# Patient Record
Sex: Male | Born: 1985 | Race: White | Hispanic: No | Marital: Single | Smoking: Current some day smoker
Health system: Southern US, Community
[De-identification: ages and names within clinical notes are randomized; demographics above are authoritative.]

## PROBLEM LIST (undated history)

## (undated) DIAGNOSIS — Z8249 Family history of ischemic heart disease and other diseases of the circulatory system: Secondary | ICD-10-CM

## (undated) DIAGNOSIS — I10 Essential (primary) hypertension: Secondary | ICD-10-CM

## (undated) DIAGNOSIS — R739 Hyperglycemia, unspecified: Secondary | ICD-10-CM

## (undated) DIAGNOSIS — J45909 Unspecified asthma, uncomplicated: Secondary | ICD-10-CM

## (undated) DIAGNOSIS — Z720 Tobacco use: Secondary | ICD-10-CM

## (undated) DIAGNOSIS — I498 Other specified cardiac arrhythmias: Secondary | ICD-10-CM

## (undated) DIAGNOSIS — R9431 Abnormal electrocardiogram [ECG] [EKG]: Secondary | ICD-10-CM

## (undated) DIAGNOSIS — J939 Pneumothorax, unspecified: Secondary | ICD-10-CM

## (undated) DIAGNOSIS — G4733 Obstructive sleep apnea (adult) (pediatric): Secondary | ICD-10-CM

## (undated) DIAGNOSIS — Z9989 Dependence on other enabling machines and devices: Principal | ICD-10-CM

## (undated) DIAGNOSIS — E119 Type 2 diabetes mellitus without complications: Secondary | ICD-10-CM

## (undated) HISTORY — DX: Abnormal electrocardiogram (ECG) (EKG): R94.31

## (undated) HISTORY — DX: Dependence on other enabling machines and devices: Z99.89

## (undated) HISTORY — PX: APPENDECTOMY: SHX54

## (undated) HISTORY — DX: Obstructive sleep apnea (adult) (pediatric): G47.33

---

## 2012-11-15 ENCOUNTER — Emergency Department (HOSPITAL_BASED_OUTPATIENT_CLINIC_OR_DEPARTMENT_OTHER)
Admission: EM | Admit: 2012-11-15 | Discharge: 2012-11-15 | Disposition: A | Discharge status: Home / Self Care | Payer: PRIVATE HEALTH INSURANCE | Attending: Emergency Medicine | Admitting: Emergency Medicine

## 2012-11-15 ENCOUNTER — Encounter (HOSPITAL_BASED_OUTPATIENT_CLINIC_OR_DEPARTMENT_OTHER): Payer: Self-pay | Admitting: *Deleted

## 2012-11-15 DIAGNOSIS — R51 Headache: Secondary | ICD-10-CM | POA: Insufficient documentation

## 2012-11-15 DIAGNOSIS — H9209 Otalgia, unspecified ear: Secondary | ICD-10-CM | POA: Insufficient documentation

## 2012-11-15 DIAGNOSIS — J029 Acute pharyngitis, unspecified: Secondary | ICD-10-CM | POA: Insufficient documentation

## 2012-11-15 DIAGNOSIS — R509 Fever, unspecified: Secondary | ICD-10-CM | POA: Insufficient documentation

## 2012-11-15 DIAGNOSIS — J3489 Other specified disorders of nose and nasal sinuses: Secondary | ICD-10-CM | POA: Insufficient documentation

## 2012-11-15 DIAGNOSIS — J45909 Unspecified asthma, uncomplicated: Secondary | ICD-10-CM | POA: Insufficient documentation

## 2012-11-15 DIAGNOSIS — R42 Dizziness and giddiness: Secondary | ICD-10-CM | POA: Insufficient documentation

## 2012-11-15 HISTORY — DX: Unspecified asthma, uncomplicated: J45.909

## 2012-11-15 LAB — RAPID STREP SCREEN (MED CTR MEBANE ONLY): Streptococcus, Group A Screen (Direct): NEGATIVE

## 2012-11-15 LAB — MONONUCLEOSIS SCREEN: Mono Screen: NEGATIVE

## 2012-11-15 MED ORDER — OXYCODONE-ACETAMINOPHEN 5-325 MG PO TABS
1.0000 | ORAL_TABLET | Freq: Once | ORAL | Status: AC
  Administered 2012-11-15: 1 via ORAL
  Filled 2012-11-15 (×2): qty 1

## 2012-11-15 MED ORDER — PENICILLIN V POTASSIUM 500 MG PO TABS: 500.0000 mg | ORAL_TABLET | Freq: Four times a day (QID) | ORAL | Status: AC

## 2012-11-15 NOTE — ED Notes (Signed)
Pt ambulatory to triage exam room noted a white arm band on ask what it was pt states he was at [facility] and it was taking them too long to see him so he left and came here.

## 2012-11-15 NOTE — ED Notes (Signed)
3 days of cough sore throat congestion and ear ache states sputum is yellow to greenish

## 2012-11-15 NOTE — ED Provider Notes (Signed)
History     CSN: [id number]  Arrival date & time 11/15/12  1403   First MD Initiated Contact with Patient 11/15/12 1436      Chief Complaint  Patient presents with  . Otalgia  . Sore Throat    HPI Emily Forse is a 27 y.o. male who presents to the ED with sore throat. This is a new problem. The sore throat started 4 days ago. Associated symptoms include fever, headache, ear ache and swollen glands. He rates his pain as 10/10. The history was provided by the patient.  Past Medical History  Diagnosis Date  . Asthma     Past Surgical History  Procedure Laterality Date  . Appendectomy      History reviewed. No pertinent family history.  History  Substance Use Topics  . Smoking status: Never Smoker   . Smokeless tobacco: Not on file  . Alcohol Use: No      Review of Systems  Constitutional: Positive for fever and chills. Negative for diaphoresis and fatigue.  HENT: Positive for ear pain, congestion, sore throat and sinus pressure. Negative for facial swelling, neck pain, neck stiffness and dental problem. Trouble swallowing: pain with swallowing.   Eyes: Negative for photophobia, pain, discharge and visual disturbance.  Respiratory: Positive for cough. Negative for chest tightness and wheezing.   Cardiovascular: Negative for chest pain and palpitations.  Gastrointestinal: Negative for nausea, vomiting, abdominal pain, diarrhea, constipation and abdominal distention.  Genitourinary: Negative for dysuria, frequency, flank pain and difficulty urinating.  Musculoskeletal: Negative for myalgias, back pain and gait problem.  Skin: Negative for color change and rash.  Allergic/Immunologic: Negative for food allergies and immunocompromised state.  Neurological: Positive for dizziness, light-headedness and headaches. Negative for speech difficulty, weakness and numbness.  Psychiatric/Behavioral: Negative for confusion and agitation. The patient is not nervous/anxious.      Allergies  Review of patient's allergies indicates no known allergies.  Home Medications  No current outpatient prescriptions on file.  BP 132/99  Pulse 94  Temp(Src) 98.4 F (36.9 C) (Oral)  Ht 5\' 9"  (1.753 m)  Wt 245 lb (111.131 kg)  BMI 36.16 kg/m2  SpO2 97%  Physical Exam  Nursing note and vitals reviewed. Constitutional: He is oriented to person, place, and time. He appears well-developed and well-nourished. No distress.  Elevated BP  HENT:  Head: Normocephalic and atraumatic.  Right Ear: Tympanic membrane normal. No drainage.  Left Ear: Tympanic membrane normal. No drainage.  Nose: Nose normal.  Mouth/Throat: Uvula is midline and mucous membranes are normal. Posterior oropharyngeal erythema present.  Eyes: EOM are normal. Pupils are equal, round, and reactive to light.  Neck: Neck supple.  Cardiovascular: Normal rate and regular rhythm.   Pulmonary/Chest: Effort normal. No respiratory distress. He has no wheezes.  Abdominal: Soft. Bowel sounds are normal. There is no tenderness.  Musculoskeletal: Normal range of motion. He exhibits no edema.  Lymphadenopathy:    He has cervical adenopathy.  Neurological: He is alert and oriented to person, place, and time. No cranial nerve deficit.  Skin: Skin is warm and dry.  Psychiatric: He has a normal mood and affect. His behavior is normal. Judgment and thought content normal.   Procedures  Results for orders placed during the hospital encounter of 11/15/12 (from the past 24 hour(s))  RAPID STREP SCREEN     Status: None   Collection Time    11/15/12  2:47 PM      Result Value Range   Streptococcus, Group A  Screen (Direct) NEGATIVE  NEGATIVE  MONONUCLEOSIS SCREEN     Status: None   Collection Time    11/15/12  3:50 PM      Result Value Range   Mono Screen NEGATIVE  NEGATIVE    Assessment: 27 y.o. male with sore tharoat   Pharyngitis   Strep and mono negative   Strep culture pending  Plan:  Pain management with  tylenol and ibuprofen   Start Pen VK while culture pending. Discussed with the patient and all questioned fully answered. He will return if any problems arise.    Medication List    TAKE these medications       penicillin v potassium 500 MG tablet  Commonly known as:  VEETID  Take 1 tablet (500 mg total) by mouth 4 (four) times daily.          Janne Napoleon, [place] 11/15/12 [phone]

## 2012-11-16 LAB — STREP A DNA PROBE: Group A Strep Probe: NEGATIVE

## 2012-11-16 NOTE — ED Provider Notes (Signed)
Medical screening examination/treatment/procedure(s) were performed by non-physician practitioner and as supervising physician I was immediately available for consultation/collaboration.  Milferd Ansell, MD 11/16/12 0651 

## 2013-05-06 ENCOUNTER — Encounter (HOSPITAL_BASED_OUTPATIENT_CLINIC_OR_DEPARTMENT_OTHER): Payer: Self-pay | Admitting: *Deleted

## 2013-05-06 ENCOUNTER — Emergency Department (HOSPITAL_BASED_OUTPATIENT_CLINIC_OR_DEPARTMENT_OTHER)
Admission: EM | Admit: 2013-05-06 | Discharge: 2013-05-06 | Disposition: A | Discharge status: Home / Self Care | Payer: PRIVATE HEALTH INSURANCE | Attending: Emergency Medicine | Admitting: Emergency Medicine

## 2013-05-06 DIAGNOSIS — T23202A Burn of second degree of left hand, unspecified site, initial encounter: Secondary | ICD-10-CM

## 2013-05-06 DIAGNOSIS — Y9389 Activity, other specified: Secondary | ICD-10-CM | POA: Insufficient documentation

## 2013-05-06 DIAGNOSIS — T23209A Burn of second degree of unspecified hand, unspecified site, initial encounter: Secondary | ICD-10-CM | POA: Insufficient documentation

## 2013-05-06 DIAGNOSIS — J45909 Unspecified asthma, uncomplicated: Secondary | ICD-10-CM | POA: Insufficient documentation

## 2013-05-06 DIAGNOSIS — Y929 Unspecified place or not applicable: Secondary | ICD-10-CM | POA: Insufficient documentation

## 2013-05-06 DIAGNOSIS — X010XXA Exposure to flames in uncontrolled fire, not in building or structure, initial encounter: Secondary | ICD-10-CM | POA: Insufficient documentation

## 2013-05-06 MED ORDER — IBUPROFEN 800 MG PO TABS
800.0000 mg | ORAL_TABLET | Freq: Once | ORAL | Status: AC
  Administered 2013-05-06: 800 mg via ORAL
  Filled 2013-05-06: qty 1

## 2013-05-06 MED ORDER — IBUPROFEN 600 MG PO TABS: 600.0000 mg | ORAL_TABLET | Freq: Three times a day (TID) | ORAL | Status: DC | PRN

## 2013-05-06 NOTE — ED Notes (Signed)
Per verbal order from [place], [place], dressed patients hand with bacitracin and nonstick dressing.Patient educated on care of wound at home
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
## 2015-09-05 ENCOUNTER — Emergency Department (HOSPITAL_BASED_OUTPATIENT_CLINIC_OR_DEPARTMENT_OTHER)
Admission: EM | Admit: 2015-09-05 | Discharge: 2015-09-05 | Disposition: A | Discharge status: Home / Self Care | Payer: PRIVATE HEALTH INSURANCE | Attending: Emergency Medicine | Admitting: Emergency Medicine

## 2015-09-05 ENCOUNTER — Encounter (HOSPITAL_BASED_OUTPATIENT_CLINIC_OR_DEPARTMENT_OTHER): Payer: Self-pay | Admitting: Emergency Medicine

## 2015-09-05 DIAGNOSIS — J45909 Unspecified asthma, uncomplicated: Secondary | ICD-10-CM | POA: Insufficient documentation

## 2015-09-05 DIAGNOSIS — K029 Dental caries, unspecified: Secondary | ICD-10-CM | POA: Insufficient documentation

## 2015-09-05 DIAGNOSIS — K0889 Other specified disorders of teeth and supporting structures: Secondary | ICD-10-CM | POA: Insufficient documentation

## 2015-09-05 DIAGNOSIS — G8918 Other acute postprocedural pain: Secondary | ICD-10-CM | POA: Insufficient documentation

## 2015-09-05 MED ORDER — ONDANSETRON HCL 4 MG PO TABS: 4.0000 mg | ORAL_TABLET | Freq: Four times a day (QID) | ORAL | Status: DC

## 2015-09-05 MED ORDER — OXYCODONE-ACETAMINOPHEN 5-325 MG PO TABS: 1.0000 | ORAL_TABLET | Freq: Four times a day (QID) | ORAL | Status: DC | PRN

## 2015-09-05 MED ORDER — LIDOCAINE HCL (PF) 1 % IJ SOLN
2.0000 mL | Freq: Once | INTRAMUSCULAR | Status: AC
  Administered 2015-09-05: 2 mL
  Filled 2015-09-05: qty 5

## 2015-09-05 NOTE — ED Provider Notes (Signed)
CSN: [id number]     Arrival date & time 09/05/15  1307 History   First MD Initiated Contact with Patient 09/05/15 1358     Chief Complaint  Patient presents with  . Dental Pain     (Consider location/radiation/quality/duration/timing/severity/associated sxs/prior Treatment) HPI   Patient presents to the emergency department with complaints of dental pain. He had a dental extraction done on Thursday, is feeling well up until this morning where he started having severe pain to the area. She has been taking amoxicillin and Norco as needed for his pain. He denies having fevers, nausea, vomiting, diarrhea. He denies bleeding site or discharge. He did not try calling the dentist that pulled tooth.  Past Medical History  Diagnosis Date  . Asthma    Past Surgical History  Procedure Laterality Date  . Appendectomy     History reviewed. No pertinent family history. Social History  Substance Use Topics  . Smoking status: Never Smoker   . Smokeless tobacco: None  . Alcohol Use: No    Review of Systems  All other systems reviewed and are negative.   Allergies  Review of patient's allergies indicates no known allergies.  Home Medications   Prior to Admission medications   Medication Sig Start Date End Date Taking? Authorizing Provider  ibuprofen (ADVIL,MOTRIN) 600 MG tablet Take 1 tablet (600 mg total) by mouth every 8 (eight) hours as needed for pain. 05/06/13   Azalia Bilis, MD  ondansetron (ZOFRAN) 4 MG tablet Take 1 tablet (4 mg total) by mouth every 6 (six) hours. 09/05/15   Marlon Pel, PA-C  oxyCODONE-acetaminophen (PERCOCET/ROXICET) 5-325 MG tablet Take 1 tablet by mouth every 6 (six) hours as needed for severe pain. 09/05/15   Antwonette Feliz Neva Seat, PA-C   BP 124/82 mmHg  Pulse 77  Temp(Src) 98.5 F (36.9 C) (Oral)  Resp 18  Ht  (1.753 m)  Wt 117.935 kg  BMI 38.38 kg/m2  SpO2 100% Physical Exam  Constitutional: He appears well-developed and well-nourished. No distress.    HENT:  Head: Normocephalic and atraumatic.  Mouth/Throat: Oropharynx is clear and moist. No trismus in the jaw. Normal dentition. Dental caries present. No dental abscesses, uvula swelling or lacerations.    Dry socket  Eyes: Conjunctivae and EOM are normal. Pupils are equal, round, and reactive to light.  Neck: Normal range of motion. Neck supple.  Cardiovascular: Normal rate and regular rhythm.   Pulmonary/Chest: Effort normal and breath sounds normal.  Abdominal: Soft.  Neurological: He is alert.  Skin: Skin is warm and dry.  Nursing note and vitals reviewed.   ED Course  Procedures (including critical care time) Labs Review Labs Reviewed - No data to display  Imaging Review No results found. I have personally reviewed and evaluated these images and lab results as part of my medical decision-making.   EKG Interpretation None      MDM   Final diagnoses:  Postoperative pain  Toothache   Discussed treatment with doctor Pfeiffer, Dental block performed, thoroughly irrigated socket. To call dentist on Monday for same day follow-up appt.  DENTAL NERVE BLOCK Date/Time: 09/05/2015 Performed by: Dorthula Matas Authorized by: Dorthula Matas Consent: Verbal consent obtained. Risks and benefits: risks, benefits and alternatives were discussed Consent given by: patient Indications: pain relief Body area: face/mouth Laterality: right upper Needle gauge: 25 G Local anesthetic: lidocaine 2% without epinephrine Anesthetic total: 2 ml Outcome: pain improved Patient tolerance: Patient tolerated the procedure well with no immediate complications. Comments: Patient  had complete relief of pain.  Medications  lidocaine (PF) (XYLOCAINE) 1 % injection 2 mL (2 mLs Other Given by Other 09/05/15 1427)    29 y.o.Joseph Krause's evaluation in the Emergency Department is complete.  We have discussed signs and symptoms that warrant return to the ED, such as changes or worsening in  symptoms. No emergent s/sx's present. Patent airway. No trismus.  No neck tenderness or protrusion of tongue or floor of mouth. Patient will be given an rx for Percocet 6 tabs and Zofran. He will be referred to a dentist with instructions for follow-up.  Vital signs are stable at discharge. Filed Vitals:   09/05/15 1312  BP: 124/82  Pulse: 77  Temp: 98.5 F (36.9 C)  Resp: 18    Patient/guardian has voiced understanding and agreed to follow-up with the PCP or specialist.     Marlon Peliffany Lauriann Milillo, PA-C 09/05/15 1457  Arby BarretteMarcy Pfeiffer, MD 09/05/15 1530

## 2015-09-05 NOTE — ED Notes (Signed)
Patient states that he had a root canal done on Thursday. Since he feels like the stiches have come out or apart,. He also feels like he may have dry scocket

## 2015-09-05 NOTE — Discharge Instructions (Signed)
Dental Extraction, Care After  Refer to this sheet in the next few weeks. These instructions provide you with information about caring for yourself after your procedure. Your health care provider may also give you more specific instructions. Your treatment has been planned according to current medical practices, but problems sometimes occur. Call your health care provider if you have any problems or questions after your procedure.  WHAT TO EXPECT AFTER THE PROCEDURE  After your procedure, it is common to have:   · Pain and swelling for a few days.  · Numbness for a few hours after the procedure.  HOME CARE INSTRUCTIONS  Lifestyle  · Protect the area where your tooth was extracted, even if there is no pain.  · Do not smoke, do not spit, and do not drink through a straw until your health care provider says it is okay.  · Eat soft-textured foods as directed by your health care provider. Avoid hot drinks and spicy foods until your gum has healed.  Incision Care  · Follow instructions from your health care provider about:    Incision care.    Gauze changes and removal.    Incision closure removal.  · Do not chew on the gauze.  · If you have heavy bleeding from your gum, fold a clean piece of gauze, place it on the bleeding gum, and bite on it as directed by your health care provider.  General Instructions  · Take medicines only as directed by your health care provider.  · Do not rinse your mouth until your health care provider says it is okay. Once you are told that you can rinse your mouth, do not rinse with a lot of force (vigorously). Doing that can break up the needed clot that forms where your tooth was extracted.    You may rinse your mouth with warm salt water after your health care provider says it is okay. You can make a salt rinse by mixing one teaspoon of salt in two cups of warm water. Do this as directed by your health care provider.  · Do not brush or floss near the tooth socket where your tooth was  extracted until your health care provider says it is okay. You may brush your other teeth.  · If directed, apply ice to your cheek on the affected side of your mouth:    Put ice in a plastic bag.    Place a towel between your skin and the bag.    Leave the ice on for 20 minutes, 2-3 times a day.  · Keep all follow-up visits as directed by your health care provider. This is important.  SEEK MEDICAL CARE IF:  · Your pain is not controlled with medicines.  · You have a fever with nausea, vomiting, or chills.  · You have a severe cough or shortness of breath.  SEEK IMMEDIATE MEDICAL CARE IF:  · You have uncontrolled bleeding, increased swelling, or severe pain.  · You have fluid, blood, or pus coming from the gum where your tooth was extracted.  · You have difficulty swallowing.  · You cannot open your mouth.     This information is not intended to replace advice given to you by your health care provider. Make sure you discuss any questions you have with your health care provider.     Document Released: 01/03/2011 Document Revised: 02/02/2015 Document Reviewed: 09/14/2014  Elsevier Interactive Patient Education ©2016 Elsevier Inc.

## 2015-09-09 ENCOUNTER — Emergency Department (HOSPITAL_BASED_OUTPATIENT_CLINIC_OR_DEPARTMENT_OTHER)
Admission: EM | Admit: 2015-09-09 | Discharge: 2015-09-09 | Disposition: A | Discharge status: Home / Self Care | Payer: PRIVATE HEALTH INSURANCE | Attending: Emergency Medicine | Admitting: Emergency Medicine

## 2015-09-09 ENCOUNTER — Encounter (HOSPITAL_BASED_OUTPATIENT_CLINIC_OR_DEPARTMENT_OTHER): Payer: Self-pay | Admitting: Emergency Medicine

## 2015-09-09 DIAGNOSIS — J45909 Unspecified asthma, uncomplicated: Secondary | ICD-10-CM | POA: Insufficient documentation

## 2015-09-09 DIAGNOSIS — M273 Alveolitis of jaws: Secondary | ICD-10-CM

## 2015-09-09 DIAGNOSIS — F17200 Nicotine dependence, unspecified, uncomplicated: Secondary | ICD-10-CM | POA: Insufficient documentation

## 2015-09-09 MED ORDER — BUPIVACAINE-EPINEPHRINE (PF) 0.5% -1:200000 IJ SOLN
1.8000 mL | Freq: Once | INTRAMUSCULAR | Status: AC
  Administered 2015-09-09: 1.8 mL
  Filled 2015-09-09: qty 1.8

## 2015-09-09 MED ORDER — LIDOCAINE HCL 4 % EX SOLN
Freq: Once | CUTANEOUS | Status: AC
  Administered 2015-09-09: 1 mL via TOPICAL
  Filled 2015-09-09: qty 50

## 2015-09-09 MED ORDER — ONDANSETRON 8 MG PO TBDP: 8.0000 mg | ORAL_TABLET | Freq: Three times a day (TID) | ORAL | Status: DC | PRN

## 2015-09-09 MED ORDER — HYDROMORPHONE HCL 4 MG PO TABS: 4.0000 mg | ORAL_TABLET | ORAL | Status: DC | PRN

## 2015-09-09 NOTE — ED Provider Notes (Signed)
CSN: [id number]     Arrival date & time 09/09/15  0115 History   First MD Initiated Contact with Patient 09/09/15 0217     Chief Complaint  Patient presents with  . Dental Pain     (Consider location/radiation/quality/duration/timing/severity/associated sxs/prior Treatment) HPI  This is a 29 year old male who recently had his right upper first molar extracted. Heat subsequently developed a dry socket after the clot fell out and he was seen here on December 4. A dental block with lidocaine was performed and he followed up with his dentist. Yesterday afternoon about 2 PM the clot fell out again and he is gradually developed pain that is now severe. He has tried over-the-counter medications without relief.  Past Medical History  Diagnosis Date  . Asthma    Past Surgical History  Procedure Laterality Date  . Appendectomy     History reviewed. No pertinent family history. Social History  Substance Use Topics  . Smoking status: Current Some Day Smoker  . Smokeless tobacco: None  . Alcohol Use: Yes    Review of Systems  All other systems reviewed and are negative.   Allergies  Review of patient's allergies indicates no known allergies.  Home Medications   Prior to Admission medications   Medication Sig Start Date End Date Taking? Authorizing Provider  ibuprofen (ADVIL,MOTRIN) 600 MG tablet Take 1 tablet (600 mg total) by mouth every 8 (eight) hours as needed for pain. 05/06/13   Azalia BilisKevin Campos, MD  ondansetron (ZOFRAN) 4 MG tablet Take 1 tablet (4 mg total) by mouth every 6 (six) hours. 09/05/15   Marlon Peliffany Greene, PA-C  oxyCODONE-acetaminophen (PERCOCET/ROXICET) 5-325 MG tablet Take 1 tablet by mouth every 6 (six) hours as needed for severe pain. 09/05/15   Tiffany Neva SeatGreene, PA-C   BP 125/98 mmHg  Pulse 99  Temp(Src) 98.5 F (36.9 C) (Oral)  Resp 20  Ht 5\' 9"  (1.753 m)  Wt 260 lb (117.935 kg)  BMI 38.38 kg/m2  SpO2 100%   Physical Exam  General: Well-developed, well-nourished  male in apparent discomfort; appearance consistent with age of record HENT: normocephalic; atraumatic; right upper first molar extraction socket with tenderness to percussion Eyes: pupils equal, round and reactive to light; extraocular muscles intact Neck: supple; no lymphadenopathy Heart: regular rate and rhythm Lungs: clear to auscultation bilaterally Abdomen: soft; nondistended Extremities: No deformity; full range of motion Neurologic: Awake, alert and oriented; motor function intact in all extremities and symmetric; no facial droop Skin: Warm and dry Psychiatric: Tearful    ED Course  Procedures (including critical care time)  DENTAL BLOCK 1.8 milliliters of 0.5% bupivacaine with epinephrine were injected into the buccal fold adjacent to the right upper first molar extraction socket. The patient tolerated this well and there were no immediate complications. The patient unfortunately did not experience relief of his pain despite stating the soft tissue around his tooth was "all numb". The injection was repeated a second time again without success. The extraction socket was then packed with a commercial dry socket paste containing eugenol. Again the patient did not get relief.   We will provide a prescription for Dilaudid tablets and have him follow-up with his dentist later today. MDM      Paula LibraJohn Rilee Wendling, MD 09/09/15 [phone]

## 2015-09-09 NOTE — Discharge Instructions (Signed)
Dental Dry Socket After a tooth is pulled (extracted), blood fills up the hole (socket) where the tooth once was. This blood hardens (clots) and protects the bone and nerves underneath. Normally, gums completely grow over the top of the bones and nerves and close an open socket in about a week. After several months, the clot is replaced by bone that grows into the socket. However, when blood does not fill up the extraction socket, or the blood clot is lost for some reason, a dry socket may form. This condition leaves the bone and nerves exposed to air, food, liquid, or anything else that enters the mouth. The gums cannot grow over the extraction socket because there is nothing to grow over, and the dry socket remains open.  CAUSES   Blood not filling up the extraction socket properly.  Anything that can dislodge a forming blood clot. Forceful spitting or sucking through a straw can pull a blood clot completely out of the socket and cause a dry socket.  Having a difficult extraction. The forceful pushing against the wall of the socket when the tooth is extracted causes the walls of the tooth socket to become crushed. This prevents bleeding into the socket because the blood vessels have been crushed closed.  Alcoholic drinks may dry out the blood clot and cause a dry socket.  Smoking can disturb blood clot formation and cause a dry socket. Factors that put you at an increased risk for a dry socket include:   Having lower teeth extracted.  Being male.  Poor oral hygiene.  Taking birth control pills.  Having your wisdom teeth extracted. SYMPTOMS  Severe, constant, dull throbbing pain. The pain generally begins 2 to 3 days after the tooth extraction. The pain may last about a week after it begins.  Bad smelling breath and bad taste in your mouth.  Ear pain. HOME CARE INSTRUCTIONS  Follow your dentist's instructions.  Only take over-the-counter or prescription medicines for pain,  discomfort, or fever as directed by your caregiver. If pain medication does not relieve the pain, you may need to see your dentistwho can clean the socket and place a medicated dressing on the extraction to promote healing.  Take your antibiotics as told, if prescribed. Finish them even if you start to feel better.  Wait at least a day before rinsing with warm salt water to avoid possibly dissolving the new blood clot. When salt water rinsing, spit gently to avoid pressure on the clot.  Avoid carbonated beverages.  Avoid alcohol.  Avoid smoking for a few days after surgery. Patients who have recently had oral surgery should avoid anything that may irritate the extraction socket or anything that may cause the blood clot inside the extraction socket from being dislodged. Carefully follow your instructions for after surgery care.  SEEK IMMEDIATE DENTAL CARE IF:  Your medicine does not relieve pain.  You have uncontrolled bleeding or marked swelling.  You develop a fever above 102 F (38.9 C)  You have difficulty swallowing or cannot open your mouth.  You have other severe symptoms.   This information is not intended to replace advice given to you by your health care provider. Make sure you discuss any questions you have with your health care provider.   Document Released: 03/25/2003 Document Revised: 12/11/2011 Document Reviewed: 05/03/2015 Elsevier Interactive Patient Education [employer]2016 Elsevier Inc.

## 2015-09-09 NOTE — ED Notes (Signed)
C/o pain to rt upper jaw where tooth was removed 1 week ago   States coughed today and area started bleeding  Then started having pain   States same thing happened on Sunday and was seen here

## 2016-05-19 ENCOUNTER — Encounter (HOSPITAL_BASED_OUTPATIENT_CLINIC_OR_DEPARTMENT_OTHER): Payer: Self-pay | Admitting: Emergency Medicine

## 2016-05-19 ENCOUNTER — Observation Stay (HOSPITAL_BASED_OUTPATIENT_CLINIC_OR_DEPARTMENT_OTHER)
Admission: EM | Admit: 2016-05-19 | Discharge: 2016-05-21 | Disposition: A | Discharge status: Home / Self Care | Payer: PRIVATE HEALTH INSURANCE | Attending: Cardiovascular Disease | Admitting: Cardiovascular Disease

## 2016-05-19 ENCOUNTER — Emergency Department (HOSPITAL_BASED_OUTPATIENT_CLINIC_OR_DEPARTMENT_OTHER): Payer: Self-pay

## 2016-05-19 DIAGNOSIS — R9431 Abnormal electrocardiogram [ECG] [EKG]: Secondary | ICD-10-CM | POA: Insufficient documentation

## 2016-05-19 DIAGNOSIS — Z720 Tobacco use: Secondary | ICD-10-CM | POA: Diagnosis present

## 2016-05-19 DIAGNOSIS — R079 Chest pain, unspecified: Secondary | ICD-10-CM

## 2016-05-19 DIAGNOSIS — Z8249 Family history of ischemic heart disease and other diseases of the circulatory system: Secondary | ICD-10-CM | POA: Insufficient documentation

## 2016-05-19 DIAGNOSIS — F17200 Nicotine dependence, unspecified, uncomplicated: Secondary | ICD-10-CM | POA: Insufficient documentation

## 2016-05-19 DIAGNOSIS — R400 Somnolence: Secondary | ICD-10-CM | POA: Insufficient documentation

## 2016-05-19 DIAGNOSIS — Z6841 Body Mass Index (BMI) 40.0 and over, adult: Secondary | ICD-10-CM | POA: Insufficient documentation

## 2016-05-19 DIAGNOSIS — R072 Precordial pain: Secondary | ICD-10-CM | POA: Diagnosis not present

## 2016-05-19 DIAGNOSIS — R0789 Other chest pain: Principal | ICD-10-CM | POA: Insufficient documentation

## 2016-05-19 DIAGNOSIS — R739 Hyperglycemia, unspecified: Secondary | ICD-10-CM | POA: Diagnosis not present

## 2016-05-19 DIAGNOSIS — I517 Cardiomegaly: Secondary | ICD-10-CM | POA: Insufficient documentation

## 2016-05-19 HISTORY — DX: Family history of ischemic heart disease and other diseases of the circulatory system: Z82.49

## 2016-05-19 HISTORY — DX: Hyperglycemia, unspecified: R73.9

## 2016-05-19 HISTORY — DX: Pneumothorax, unspecified: J93.9

## 2016-05-19 HISTORY — DX: Morbid (severe) obesity due to excess calories: E66.01

## 2016-05-19 HISTORY — DX: Tobacco use: Z72.0

## 2016-05-19 LAB — CBC
HCT: 41.7 % (ref 39.0–52.0)
HCT: 43 % (ref 39.0–52.0)
HEMOGLOBIN: 14.1 g/dL (ref 13.0–17.0)
Hemoglobin: 14.5 g/dL (ref 13.0–17.0)
MCH: 30.8 pg (ref 26.0–34.0)
MCH: 29.8 pg (ref 26.0–34.0)
MCHC: 34.8 g/dL (ref 30.0–36.0)
MCHC: 32.8 g/dL (ref 30.0–36.0)
MCV: 88.5 fL (ref 78.0–100.0)
MCV: 90.9 fL (ref 78.0–100.0)
PLATELETS: 273 10*3/uL (ref 150–400)
Platelets: 289 10*3/uL (ref 150–400)
RBC: 4.71 MIL/uL (ref 4.22–5.81)
RBC: 4.73 MIL/uL (ref 4.22–5.81)
RDW: 13.4 % (ref 11.5–15.5)
RDW: 13.3 % (ref 11.5–15.5)
WBC: 7.1 10*3/uL (ref 4.0–10.5)
WBC: 8.4 10*3/uL (ref 4.0–10.5)

## 2016-05-19 LAB — BRAIN NATRIURETIC PEPTIDE: B Natriuretic Peptide: 10.1 pg/mL (ref 0.0–100.0)

## 2016-05-19 LAB — PROTIME-INR
INR: 0.98
PROTHROMBIN TIME: 13 s (ref 11.4–15.2)

## 2016-05-19 LAB — LIPID PANEL
Cholesterol: 189 mg/dL (ref 0–200)
HDL: 27 mg/dL — ABNORMAL LOW
LDL Cholesterol: 97 mg/dL (ref 0–99)
Total CHOL/HDL Ratio: 7 ratio
Triglycerides: 327 mg/dL — ABNORMAL HIGH
VLDL: 65 mg/dL — ABNORMAL HIGH (ref 0–40)

## 2016-05-19 LAB — BASIC METABOLIC PANEL
ANION GAP: 8 (ref 5–15)
BUN: 11 mg/dL (ref 6–20)
CALCIUM: 8.8 mg/dL — AB (ref 8.9–10.3)
CO2: 25 mmol/L (ref 22–32)
Chloride: 103 mmol/L (ref 101–111)
Creatinine, Ser: 0.71 mg/dL (ref 0.61–1.24)
GFR calc Af Amer: >60 mL/min (ref >60)
Glucose, Bld: 217 mg/dL — ABNORMAL HIGH (ref 65–99)
POTASSIUM: 4.2 mmol/L (ref 3.5–5.1)
SODIUM: 136 mmol/L (ref 135–145)

## 2016-05-19 LAB — RAPID URINE DRUG SCREEN, HOSP PERFORMED
Amphetamines: NOT DETECTED
Barbiturates: NOT DETECTED
Benzodiazepines: NOT DETECTED
Cocaine: NOT DETECTED
Opiates: NOT DETECTED
Tetrahydrocannabinol: NOT DETECTED

## 2016-05-19 LAB — CREATININE, SERUM
CREATININE: 0.79 mg/dL (ref 0.61–1.24)
GFR calc Af Amer: >60 mL/min (ref >60)

## 2016-05-19 LAB — TROPONIN I

## 2016-05-19 LAB — TSH: TSH: 2.568 u[IU]/mL (ref 0.350–4.500)

## 2016-05-19 MED ORDER — SODIUM CHLORIDE 0.9% FLUSH
3.0000 mL | Freq: Two times a day (BID) | INTRAVENOUS | Status: DC
Start: 2016-05-19 — End: 2016-05-21
  Administered 2016-05-19 – 2016-05-20 (×4): 3 mL via INTRAVENOUS

## 2016-05-19 MED ORDER — NITROGLYCERIN 0.4 MG SL SUBL: 0.4000 mg | SUBLINGUAL_TABLET | SUBLINGUAL | Status: DC | PRN

## 2016-05-19 MED ORDER — ACETAMINOPHEN 325 MG PO TABS: 650.0000 mg | ORAL_TABLET | ORAL | Status: DC | PRN

## 2016-05-19 MED ORDER — ONDANSETRON HCL 4 MG/2ML IJ SOLN
4.0000 mg | Freq: Four times a day (QID) | INTRAMUSCULAR | Status: DC | PRN
Start: 2016-05-19 — End: 2016-05-21

## 2016-05-19 MED ORDER — DIPHENHYDRAMINE HCL 25 MG PO CAPS
25.0000 mg | ORAL_CAPSULE | Freq: Once | ORAL | Status: DC
  Filled 2016-05-19: qty 1

## 2016-05-19 MED ORDER — SODIUM CHLORIDE 0.9 % IV SOLN: 250.0000 mL | INTRAVENOUS | Status: DC | PRN

## 2016-05-19 MED ORDER — ASPIRIN 81 MG PO CHEW
324.0000 mg | CHEWABLE_TABLET | Freq: Once | ORAL | Status: DC
  Filled 2016-05-19 (×2): qty 4

## 2016-05-19 MED ORDER — SODIUM CHLORIDE 0.9% FLUSH: 3.0000 mL | INTRAVENOUS | Status: DC | PRN

## 2016-05-19 MED ORDER — NICOTINE 14 MG/24HR TD PT24
14.0000 mg | MEDICATED_PATCH | Freq: Every day | TRANSDERMAL | Status: DC
  Administered 2016-05-19 – 2016-05-21 (×3): 14 mg via TRANSDERMAL
  Filled 2016-05-19 (×3): qty 1

## 2016-05-19 MED ORDER — ENOXAPARIN SODIUM 40 MG/0.4ML ~~LOC~~ SOLN
40.0000 mg | SUBCUTANEOUS | Status: DC
  Administered 2016-05-19 – 2016-05-21 (×3): 40 mg via SUBCUTANEOUS
  Filled 2016-05-19 (×3): qty 0.4

## 2016-05-19 NOTE — ED Triage Notes (Signed)
Left sided chest pain since Wednesday.  Pt states he was at work outside when occurred.  Burning sensation in chest with lightheadedness, and dizziness.  Pt states co-workers stated he seemed "out of it".  Pain is still present but is improved.  Pt went to work yesterday.

## 2016-05-19 NOTE — ED Provider Notes (Signed)
[facility] Provider Note   CSN: [id number] Arrival date & time: 05/19/16  [id number]     History   Chief Complaint Chief Complaint  Patient presents with  . Chest Pain    HPI Joseph Krause is a 30 y.o. male.  30 year old male who presents with chest pain. 2 days ago, the patient was at work doing some digging and began having left-sided chest pain. He has continued to have intermittent chest pain over the past 2 days. The pain is worse with exertion at work. No change with positioning or certain movements. The pain is currently 1/10 in intensity. He endorses associated shortness of breath but no nausea, vomiting, or diaphoresis. No abdominal pain, fevers, or recent illness. No lower extremity edema or orthopnea. Patient notes a significant family history of heart disease on his father's side, with father having MI at age 28, paternal uncle with MI, paternal grandfather with multiple MIs. No hx of drug use.   The history is provided by the patient.  Chest Pain      Past Medical History:  Diagnosis Date  . Asthma   . Pneumothorax     Patient Active Problem List   Diagnosis Date Noted  . Chest pain 05/19/2016    Past Surgical History:  Procedure Laterality Date  . APPENDECTOMY         Home Medications    Prior to Admission medications   Medication Sig Start Date End Date Taking? Authorizing Provider  HYDROmorphone (DILAUDID) 4 MG tablet Take 1 tablet (4 mg total) by mouth every 4 (four) hours as needed for severe pain. 09/09/15   John Molpus, MD  ibuprofen (ADVIL,MOTRIN) 600 MG tablet Take 1 tablet (600 mg total) by mouth every 8 (eight) hours as needed for pain. 05/06/13   Azalia BilisKevin Campos, MD  ondansetron (ZOFRAN ODT) 8 MG disintegrating tablet Take 1 tablet (8 mg total) by mouth every 8 (eight) hours as needed for nausea or vomiting. 09/09/15   John Molpus, MD  ondansetron (ZOFRAN) 4 MG tablet Take 1 tablet (4 mg total) by mouth every 6 (six) hours. 09/05/15   Marlon Peliffany  Greene, PA-C  oxyCODONE-acetaminophen (PERCOCET/ROXICET) 5-325 MG tablet Take 1 tablet by mouth every 6 (six) hours as needed for severe pain. 09/05/15   Marlon Peliffany Greene, PA-C    Family History No family history on file.  Social History Social History  Substance Use Topics  . Smoking status: Current Some Day Smoker  . Smokeless tobacco: Never Used  . Alcohol use Yes     Allergies   Review of patient's allergies indicates no known allergies.   Review of Systems Review of Systems  Cardiovascular: Positive for chest pain.   10 Systems reviewed and are negative for acute change except as noted in the HPI.   Physical Exam Updated Vital Signs BP 134/91 (BP Location: Right Arm)   Pulse 78   Temp 98.4 F (36.9 C) (Oral)   Resp 23   Ht 5\' 10"  (1.778 m)   Wt 260 lb (117.9 kg)   SpO2 94%   BMI 37.31 kg/m   Physical Exam  Constitutional: He is oriented to person, place, and time. He appears well-developed and well-nourished. No distress.  HENT:  Head: Normocephalic and atraumatic.  Moist mucous membranes  Eyes: Conjunctivae are normal. Pupils are equal, round, and reactive to light.  Neck: Neck supple.  Cardiovascular: Normal rate, regular rhythm and normal heart sounds.   No murmur heard. Pulmonary/Chest: Effort normal and breath sounds  normal.  Abdominal: Soft. Bowel sounds are normal. He exhibits no distension. There is no tenderness.  Musculoskeletal: He exhibits no edema.  Neurological: He is alert and oriented to person, place, and time.  Fluent speech  Skin: Skin is warm and dry.  Psychiatric: He has a normal mood and affect. Judgment normal.  Nursing note and vitals reviewed.    ED Treatments / Results  Labs (all labs ordered are listed, but only abnormal results are displayed) Labs Reviewed  BASIC METABOLIC PANEL - Abnormal; Notable for the following:       Result Value   Glucose, Bld 217 (*)    Calcium 8.8 (*)    All other components within normal  limits  CBC  TROPONIN I  URINE RAPID DRUG SCREEN, HOSP PERFORMED  BRAIN NATRIURETIC PEPTIDE    EKG  EKG Interpretation  Date/Time:  Friday May 19 2016 09:00:05 EDT Ventricular Rate:  74 PR Interval:    QRS Duration: 129 QT Interval:  390 QTC Calculation: 433 R Axis:   -53 Text Interpretation:  Sinus rhythm Prolonged PR interval Nonspecific IVCD with LAD ST elev, probable normal early repol pattern persistent abnormal T wave/ST elevation in V2 no significant change since last tracing today Confirmed by Romy Mcgue MD, Raissa Dam [phone]) on 05/19/2016 9:11:42 AM       Radiology Dg Chest 2 View  Result Date: 05/19/2016 CLINICAL DATA:  Chest pain. EXAM: CHEST  2 VIEW COMPARISON:  No recent prior. FINDINGS: Cardiomegaly with mild pulmonary vascular prominence. Mild interstitial edema cannot be excluded . Low lung volumes. No pleural effusion or pneumothorax. IMPRESSION: Cardiomegaly with mild pulmonary vascular prominence. Mild interstitial edema cannot be excluded. Low lung volumes. Electronically Signed   By: Maisie Fushomas  Register   On: 05/19/2016 09:01    Procedures Procedures (including critical care time)  Medications Ordered in ED Medications  aspirin chewable tablet 324 mg (not administered)     Initial Impression / Assessment and Plan / ED Course  I have reviewed the triage vital signs and the nursing notes.  Pertinent labs & imaging results that were available during my care of the patient were reviewed by me and considered in my medical decision making (see chart for details).  Clinical Course   Patient presents with 2 days of intermittent chest pain that began while exerting himself at work, associated with shortness of breath. He was well-appearing on exam, vital signs notable for mild hypertension. EKG was abnormal with abnormal T wave morphology in V2, T-wave inversion in 3, borderline ST elevation in aVL. Gave the patient aspirin and obtained above labs.  Chest x-ray shows  cardiomegaly with mild pulmonary vascular congestion. Initial troponin negative. Labs notable only for hyperglycemia at 212. I informed patient of possible diabetes and need for future workup. Because of his concerning EKG findings without previous EKG available, consulted cardiology and discussed with Dr. Mayford Knifeurner. She agreed with no activation of STEMI alert but transfer for admission to workup his chest pain. [facility].  Final Clinical Impressions(s) / ED Diagnoses   Final diagnoses:  Chest pain, unspecified chest pain type  Cardiomegaly  Hyperglycemia  Abnormal finding on EKG    New Prescriptions New Prescriptions   No medications on file     Laurence Spatesachel Morgan Cheyrl Buley, MD 05/19/16 0945

## 2016-05-19 NOTE — ED Notes (Signed)
EKG performed. Pt placed on cardiac monitoring and automatic VS with continuous pulse ox

## 2016-05-19 NOTE — ED Notes (Signed)
Pt given urinal to void when able. Unable at this time.

## 2016-05-19 NOTE — H&P (Signed)
CARDIOLOGY CONSULT NOTE   Patient ID: Joseph Krause MRN: [id number] DOB/AGE: [birth year] 28 y.o.  Admit date: 05/19/2016   Primary Physician:   No PCP Per Patient Primary Cardiologist: New Reason for Admission:  Chest pain and abnormal ECG with possible Brugada syndrome  HPI: Joseph HammingMark Dimiceli is a 30 y.o. male with a history of morbid obesity, tobacco abuse, asthma and family hx of early CAD who presented [facility] today with chest pain. His ECG was noted to be abnormal and [facility] and [facility].   His past medical history is remarkable for a appendectomy over 10 years ago complicated by pneumothorax.  He has otherwise been in okay health but does not ever see a primary care provider. He doesn't remember the last time he saw Dr. He has always been overweight but feels like recently he is having a lot of trouble losing any weight no matter how little he eats.  He was in his usual state of health until last Wednesday when he was at work as a Financial traderdirectional driller. Apparently while working, his coworkers witnessed him have a episode where he "went unresponsive". Patient is unclear if he passed out or not, but he does not remember any of this time.Marland Kitchen. Per his wife telling the nurse, he was out for 2 hours?Marland Kitchen. EMS was called to the scene and apparently his blood pressure was elevated with SBP~ 220 and a heart rate of 160. His heart rate then dropped to 110 when he stood up. EMS told him he was dehydrated and requested he come to the hospital, but the patient refused and continued on with work.  Since that time he's had ongoing chest pain. The chest pain is described as a pressure that comes and goes, but is worse with exertion. It was associated with SOB, but no diaphoresis or nausea. No radiation. Last night it occurred all night as 5/10 constant chest pain. This morning, his wife finally talked him into being seen. He took an aspirin at home and by the time  [facility] got [facility],  he had very dull chest pain. Currently he is chest pain-free.  In [facility] ER, Chest x-ray showed cardiomegaly with mild pulmonary vascular congestion. Troponin and BNP normal. Labs notable only for hyperglycemia at 212.  Family history of early CAD but no sudden cardiac death.  Past Medical History:  Diagnosis Date  . Asthma   . Family history of early CAD   . Hyperglycemia   . Morbid obesity (HCC)   . Pneumothorax   . Tobacco abuse      Past Surgical History:  Procedure Laterality Date  . APPENDECTOMY      No Known Allergies  I have reviewed the patient's current medications . aspirin  324 mg Oral Once       Prior to Admission medications   Medication Sig Start Date End Date Taking? Authorizing Provider  HYDROmorphone (DILAUDID) 4 MG tablet Take 1 tablet (4 mg total) by mouth every 4 (four) hours as needed for severe pain. 09/09/15   John Molpus, MD  ibuprofen (ADVIL,MOTRIN) 600 MG tablet Take 1 tablet (600 mg total) by mouth every 8 (eight) hours as needed for pain. 05/06/13   Azalia BilisKevin Campos, MD  ondansetron (ZOFRAN ODT) 8 MG disintegrating tablet Take 1 tablet (8 mg total) by mouth every 8 (eight) hours as needed for nausea or vomiting. 09/09/15   Jonny RuizJohn  Molpus, MD  ondansetron (ZOFRAN) 4 MG tablet Take 1 tablet (4 mg total) by mouth every 6 (six) hours. 09/05/15   Marlon Peliffany Greene, PA-C  oxyCODONE-acetaminophen (PERCOCET/ROXICET) 5-325 MG tablet Take 1 tablet by mouth every 6 (six) hours as needed for severe pain. 09/05/15   Marlon Peliffany Greene, PA-C     Social History   Social History  . Marital status: Married    Spouse name: N/A  . Number of children: N/A  . Years of education: N/A   Occupational History  . Not on file.   Social History Main Topics  . Smoking status: Current Some Day Smoker  . Smokeless tobacco: Never Used  . Alcohol use Yes  . Drug use: No  . Sexual activity: Yes    Birth control/ protection: Other-see comments   Other  Topics Concern  . Not on file   Social History Narrative  . No narrative on file    No family status information on file.   Family hx: of CAD in his father (first MI at 7544), uncle (MI at 2048) and paternal GF ( multiple MIs and CHF). Mother and father both have diabetes.  ROS:  Full 14 point review of systems complete and found to be negative unless listed above.  Physical Exam: Blood pressure 111/75, pulse 73, temperature 99 F (37.2 C), temperature source Oral, resp. rate 18, height 5\' 10"  (1.778 m), weight (!) 301 lb 3.2 oz (136.6 kg), SpO2 98 %.  General: Well developed, well nourished, male in no acute distress Head: Eyes PERRLA, No xanthomas.   Normocephalic and atraumatic, oropharynx without edema or exudate.  Lungs:  Heart: HRRR S1 S2, no rub/gallop, Heart irregular rate and rhythm with S1, S2  murmur. pulses are 2+ extrem.   Neck: No carotid bruits. No lymphadenopathy.  JVD. Abdomen: Bowel sounds present, abdomen soft and non-tender without masses or hernias noted. Msk:  No spine or cva tenderness. No weakness, no joint deformities or effusions. Extremities: No clubbing or cyanosis.  edema.  Neuro: Alert and oriented X 3. No focal deficits noted. Psych:  Good affect, responds appropriately Skin: No rashes or lesions noted.  Labs:   Lab Results  Component Value Date   WBC 7.1 05/19/2016   HGB 14.5 05/19/2016   HCT 41.7 05/19/2016   MCV 88.5 05/19/2016   PLT 273 05/19/2016   No results for input(s): INR in the last 72 hours.   Recent Labs Lab 05/19/16 0840  NA 136  K 4.2  CL 103  CO2 25  BUN 11  CREATININE 0.71  CALCIUM 8.8*  GLUCOSE 217*   No results found for: MG  Recent Labs  05/19/16 0840  TROPONINI <0.03    Echo: none   ECG:  Sinus heart rate 74. Prolonged PR, ST elevation in V2 through V3  Radiology:  Dg Chest 2 View  Result Date: 05/19/2016 CLINICAL DATA:  Chest pain. EXAM: CHEST  2 VIEW COMPARISON:  No recent prior. FINDINGS: Cardiomegaly  with mild pulmonary vascular prominence. Mild interstitial edema cannot be excluded . Low lung volumes. No pleural effusion or pneumothorax. IMPRESSION: Cardiomegaly with mild pulmonary vascular prominence. Mild interstitial edema cannot be excluded. Low lung volumes. Electronically Signed   By: Maisie Fushomas  Register   On: 05/19/2016 09:01    ASSESSMENT AND PLAN:    Active Problems:   Chest pain   Family history of early CAD   Hyperglycemia   Morbid obesity (HCC)   Tobacco abuse  Chest pain: with atypical  and typical features. He says it is worse with exertion, but he had chest pain all night last night keeping him from sleep. He took aspirin at home and now chest pain has resolved completely. He does have many risk factors for CAD including a strong family history of early CAD, obesity, tobacco abuse and likely undiagnosed diabetes, HTN & HLD. Troponin negative 1. However chest x-ray shows some possible pulmonary vascular congestion and cardiomegaly. Will order a 2-D echo today. Plan for exercise Myoview tomorrow a.m. Nothing by mouth after midnight. This will have to be a 2 day study due to his morbid obesity.  Abnormal ECG: Initially there was some concern for Brugada syndrome. I reviewed ECG with Dr. Excell Seltzer who is not convinced. We will review tracing with our electrophysiology colleagues.  Syncope: He had an episode of? 2 hours of nonresponsiveness. The patient said he never fully lost conscious but doesn't remember anything. Continue to monitor on telemetry  Super morbid obesity: Body mass index is 43.22 kg/m. He says he cannot loose weight despite not eating a lot of calories. Will check a TSH.  Hyperglycemia: BG >200, likely an undiagnosed diabetic. Will check HgA1c.   Tobacco abuse: He smokes about 1-1/2 packs per day 12 years. Discussed the importance of complete cessation  Snoring: likely has OSA, but sleep study can be deferred to outpatient setting.  Signed: Cline Crock,  PA-C 05-28-2016 2:03 PM  Pager [phone]  Co-Sign MD  Patient seen, examined. Available data reviewed. Agree with findings, assessment, and plan as outlined by Cline Crock, PA-C. The patient is morbidly obese. He is in no acute distress. JVP is normal. No carotid bruits. Lungs are clear, heart is regular rate and rhythm without murmur, extremities show no edema. EKG shows a nonspecific IVCD without acute ST/T-wave changes. Initial troponin is negative. He has chest pain with typical and atypical features. The patient is young but has multiple uncontrolled cardiac risk factors including morbid obesity, tobacco abuse, obstructive sleep apnea, and probably undiagnosed diabetes. I have recommended an exercise Myoview stress test for further risk stratification. He needs major lifestyle modification. There initially was a concern about Brugada syndrome but I do not think he meets criteria. There is no family history of sudden death.  Tonny Bollman, M.D. 2016-05-28 4:52 PM

## 2016-05-20 ENCOUNTER — Observation Stay (HOSPITAL_COMMUNITY): Payer: Self-pay

## 2016-05-20 ENCOUNTER — Observation Stay (HOSPITAL_BASED_OUTPATIENT_CLINIC_OR_DEPARTMENT_OTHER): Payer: PRIVATE HEALTH INSURANCE

## 2016-05-20 ENCOUNTER — Encounter (HOSPITAL_COMMUNITY): Payer: Self-pay

## 2016-05-20 DIAGNOSIS — Z720 Tobacco use: Secondary | ICD-10-CM

## 2016-05-20 DIAGNOSIS — R079 Chest pain, unspecified: Secondary | ICD-10-CM

## 2016-05-20 DIAGNOSIS — R0789 Other chest pain: Secondary | ICD-10-CM

## 2016-05-20 DIAGNOSIS — Z8249 Family history of ischemic heart disease and other diseases of the circulatory system: Secondary | ICD-10-CM | POA: Diagnosis not present

## 2016-05-20 DIAGNOSIS — E785 Hyperlipidemia, unspecified: Secondary | ICD-10-CM | POA: Diagnosis not present

## 2016-05-20 DIAGNOSIS — R9431 Abnormal electrocardiogram [ECG] [EKG]: Secondary | ICD-10-CM | POA: Diagnosis not present

## 2016-05-20 LAB — COMPREHENSIVE METABOLIC PANEL
ALT: 44 U/L (ref 17–63)
ANION GAP: 5 (ref 5–15)
AST: 26 U/L (ref 15–41)
Albumin: 3.2 g/dL — ABNORMAL LOW (ref 3.5–5.0)
Alkaline Phosphatase: 51 U/L (ref 38–126)
BUN: 11 mg/dL (ref 6–20)
CHLORIDE: 104 mmol/L (ref 101–111)
CO2: 27 mmol/L (ref 22–32)
Calcium: 9.1 mg/dL (ref 8.9–10.3)
Creatinine, Ser: 0.67 mg/dL (ref 0.61–1.24)
GFR calc non Af Amer: >60 mL/min (ref >60)
Glucose, Bld: 189 mg/dL — ABNORMAL HIGH (ref 65–99)
POTASSIUM: 4.2 mmol/L (ref 3.5–5.1)
SODIUM: 136 mmol/L (ref 135–145)
Total Bilirubin: 0.4 mg/dL (ref 0.3–1.2)
Total Protein: 6.1 g/dL — ABNORMAL LOW (ref 6.5–8.1)

## 2016-05-20 LAB — CBC
HCT: 40.3 % (ref 39.0–52.0)
Hemoglobin: 13.5 g/dL (ref 13.0–17.0)
MCH: 30.7 pg (ref 26.0–34.0)
MCHC: 33.5 g/dL (ref 30.0–36.0)
MCV: 91.6 fL (ref 78.0–100.0)
PLATELETS: 251 10*3/uL (ref 150–400)
RBC: 4.4 MIL/uL (ref 4.22–5.81)
RDW: 13.4 % (ref 11.5–15.5)
WBC: 6.9 10*3/uL (ref 4.0–10.5)

## 2016-05-20 LAB — LIPID PANEL
Cholesterol: 193 mg/dL (ref 0–200)
HDL: 21 mg/dL — AB (ref >40)
LDL CALC: UNDETERMINED mg/dL (ref 0–99)
TRIGLYCERIDES: 694 mg/dL — AB (ref <150)
Total CHOL/HDL Ratio: 9.2 RATIO
VLDL: UNDETERMINED mg/dL (ref 0–40)

## 2016-05-20 LAB — ECHOCARDIOGRAM COMPLETE
Height: 70 in
Weight: 4840 oz

## 2016-05-20 LAB — HEMOGLOBIN A1C
HEMOGLOBIN A1C: 8.2 % — AB (ref 4.8–5.6)
MEAN PLASMA GLUCOSE: 189 mg/dL

## 2016-05-20 LAB — PROTIME-INR
INR: 0.93
Prothrombin Time: 12.4 seconds (ref 11.4–15.2)

## 2016-05-20 LAB — TROPONIN I: Troponin I: <0.03 ng/mL (ref <0.03)

## 2016-05-20 MED ORDER — REGADENOSON 0.4 MG/5ML IV SOLN
INTRAVENOUS | Status: AC
  Filled 2016-05-20: qty 5

## 2016-05-20 MED ORDER — FENOFIBRATE 160 MG PO TABS
160.0000 mg | ORAL_TABLET | Freq: Every day | ORAL | Status: DC
  Administered 2016-05-20 – 2016-05-21 (×2): 160 mg via ORAL
  Filled 2016-05-20 (×2): qty 1

## 2016-05-20 MED ORDER — HYDRALAZINE HCL 20 MG/ML IJ SOLN
10.0000 mg | Freq: Once | INTRAMUSCULAR | Status: AC
  Administered 2016-05-20: 10 mg via INTRAVENOUS

## 2016-05-20 MED ORDER — ROSUVASTATIN CALCIUM 10 MG PO TABS
10.0000 mg | ORAL_TABLET | Freq: Every day | ORAL | Status: DC
  Administered 2016-05-20: 10 mg via ORAL
  Filled 2016-05-20: qty 1

## 2016-05-20 MED ORDER — HYDRALAZINE HCL 20 MG/ML IJ SOLN
INTRAMUSCULAR | Status: AC
  Filled 2016-05-20: qty 1

## 2016-05-20 MED ORDER — REGADENOSON 0.4 MG/5ML IV SOLN
0.4000 mg | Freq: Once | INTRAVENOUS | Status: AC
  Administered 2016-05-20: 0.4 mg via INTRAVENOUS

## 2016-05-20 NOTE — Progress Notes (Signed)
Lexiscan completed. Pt in no distress. Blood pressure high. Hydralazine 10mg  IV given.

## 2016-05-20 NOTE — Progress Notes (Addendum)
Patient Name: Joseph Krause Date of Encounter: 05/20/2016  Principal Problem:   Chest pain Active Problems:   Family history of early CAD   Hyperglycemia   Morbid obesity (HCC)   Tobacco abuse   Primary Cardiologist: new Patient Profile: Joseph Krause is a 30 y.o. male with a history of morbid obesity, tobacco abuse, asthma and family hx of early CAD who presented [facility] today with chest pain. His ECG was noted to be abnormal and he was sent to [facility] for further evaluation and work up.   SUBJECTIVE: Seen today in nuc med. SOB with walking on treadmill.   OBJECTIVE Vitals:   05/19/16 2124 05/20/16 0616 05/20/16 0903 05/20/16 0936  BP: 116/63 (!) 142/73 128/85 (!) 160/67  Pulse: 78 74    Resp: (!) 23 (!) 21    Temp: 98.6 F (37 C) 97.6 F (36.4 C)    TempSrc: Oral Oral    SpO2: 96% 98%    Weight:  (!) 302 lb 8 oz (137.2 kg)    Height:        Intake/Output Summary (Last 24 hours) at 05/20/16 0942 Last data filed at 05/19/16 2200  Gross per 24 hour  Intake              480 ml  Output              295 ml  Net              185 ml   Filed Weights   05/19/16 0843 05/19/16 1115 05/20/16 0616  Weight: 260 lb (117.9 kg) (!) 301 lb 3.2 oz (136.6 kg) (!) 302 lb 8 oz (137.2 kg)    PHYSICAL EXAM General: Well developed, well nourished,obese  male in no acute distress. Head: Normocephalic, atraumatic.  Neck: Supple without bruits, no JVD. Lungs:  Resp regular and unlabored, CTA. Heart: RRR, S1, S2, no S3, S4, or murmur; no rub. Abdomen: Soft, non-tender, non-distended, BS + x 4.  Extremities: No clubbing, cyanosis, no edema.  Neuro: Alert and oriented X 3. Moves all extremities spontaneously. Psych: Normal affect.  LABS: CBC: Recent Labs  05/19/16 1507 05/20/16 0157  WBC 8.4 6.9  HGB 14.1 13.5  HCT 43.0 40.3  MCV 90.9 91.6  PLT 289 251   INR: Recent Labs  05/20/16 0157  INR 0.93   Basic Metabolic Panel: Recent Labs  05/19/16 0840  05/19/16 1507 05/20/16 0157  NA 136  --  136  K 4.2  --  4.2  CL 103  --  104  CO2 25  --  27  GLUCOSE 217*  --  189*  BUN 11  --  11  CREATININE 0.71 0.79 0.67  CALCIUM 8.8*  --  9.1   Liver Function Tests: Recent Labs  05/20/16 0157  AST 26  ALT 44  ALKPHOS 51  BILITOT 0.4  PROT 6.1*  ALBUMIN 3.2*   Cardiac Enzymes: Recent Labs  05/19/16 1507 05/19/16 1957 05/20/16 0157  TROPONINI <0.03 <0.03 <0.03  BNP:  B Natriuretic Peptide  Date/Time Value Ref Range Status  05/19/2016 08:40 AM 10.1 0.0 - 100.0 pg/mL Final   Hemoglobin A1C: Recent Labs  05/19/16 1507  HGBA1C 8.2*   Fasting Lipid Panel: Recent Labs  05/20/16 0157  CHOL 193  HDL 21*  LDLCALC UNABLE TO CALCULATE IF TRIGLYCERIDE OVER 400 mg/dL  TRIG 960*  CHOLHDL 9.2   Thyroid Function Tests: Recent Labs  05/19/16 1507  TSH 2.568     Current Facility-Administered Medications:  .  0.9 %  sodium chloride infusion, 250 mL, Intravenous, PRN, Janetta HoraKathryn R Thompson, PA-C .  acetaminophen (TYLENOL) tablet 650 mg, 650 mg, Oral, Q4H PRN, Janetta HoraKathryn R Thompson, PA-C .  aspirin chewable tablet 324 mg, 324 mg, Oral, Once, Ambrose Finlandachel Morgan Little, MD .  diphenhydrAMINE (BENADRYL) capsule 25 mg, 25 mg, Oral, Once, Marat Fudim, MD .  enoxaparin (LOVENOX) injection 40 mg, 40 mg, Subcutaneous, Q24H, Janetta HoraKathryn R Thompson, PA-C, 40 mg at 05/19/16 1439 .  nicotine (NICODERM CQ - dosed in mg/24 hours) patch 14 mg, 14 mg, Transdermal, Daily, Macario GoldsMarat Fudim, MD, 14 mg at 05/19/16 2039 .  nitroGLYCERIN (NITROSTAT) SL tablet 0.4 mg, 0.4 mg, Sublingual, Q5 Min x 3 PRN, Janetta HoraKathryn R Thompson, PA-C .  ondansetron [facility](ZOFRAN) injection 4 mg, 4 mg, Intravenous, Q6H PRN, Janetta HoraKathryn R Thompson, PA-C .  regadenoson (LEXISCAN) injection SOLN 0.4 mg, 0.4 mg, Intravenous, Once, Janetta HoraKathryn R Thompson, PA-C .  sodium chloride flush (NS) 0.9 % injection 3 mL, 3 mL, Intravenous, Q12H, Janetta HoraKathryn R Thompson, PA-C, 3 mL at 05/20/16 [id number] .  sodium chloride flush (NS)  0.9 % injection 3 mL, 3 mL, Intravenous, PRN, Janetta HoraKathryn R Thompson, PA-C   TELE: NSR       ECG: persistent ST elevation in V2.   Radiology/Studies: Dg Chest 2 View  Result Date: 05/19/2016 CLINICAL DATA:  Chest pain. EXAM: CHEST  2 VIEW COMPARISON:  No recent prior. FINDINGS: Cardiomegaly with mild pulmonary vascular prominence. Mild interstitial edema cannot be excluded . Low lung volumes. No pleural effusion or pneumothorax. IMPRESSION: Cardiomegaly with mild pulmonary vascular prominence. Mild interstitial edema cannot be excluded. Low lung volumes. Electronically Signed   By: Maisie Fushomas  Register   On: 05/19/2016 09:01     Current Medications:  . aspirin  324 mg Oral Once  . diphenhydrAMINE  25 mg Oral Once  . enoxaparin (LOVENOX) injection  40 mg Subcutaneous Q24H  . nicotine  14 mg Transdermal Daily  . regadenoson  0.4 mg Intravenous Once  . sodium chloride flush  3 mL Intravenous Q12H      ASSESSMENT AND PLAN: Principal Problem:   Chest pain Active Problems:   Family history of early CAD   Hyperglycemia   Morbid obesity (HCC)   Tobacco abuse  Chest pain: with atypical and typical features. He says it is worse with exertion. He took aspirin at home and now chest pain has resolved completely. He does have many risk factors for CAD including a strong family history of early CAD, obesity, tobacco abuse and likely undiagnosed diabetes, HTN & HLD. Troponin negative 1. However chest x-ray shows some possible pulmonary vascular congestion and cardiomegaly. 2D echo pending.   Plan for exercise Myoview today.  This will have to be a 2 day study due to his morbid obesity.  Abnormal ECG: Initially there was some concern for Brugada syndrome. I reviewed ECG with Dr. Johney FrameAllred who says that it could potentially represent Brugada.  He apparently had an episode of ? unresponsiveness for 2 hours but apparently never fully lost consiousness but does not remember anything.    Syncope: He had an  episode of? 2 hours of nonresponsiveness. The patient said he never fully lost conscious but doesn't remember anything. Continue to monitor on telemetry  Super morbid obesity: Body mass index is 43.22 kg/m. He says he cannot loose weight despite not eating a lot of calories. Will check a TSH.  Hyperglycemia: BG >200,  likely an undiagnosed diabetic. HgA1c elevated at 8.2.  Will get Diabetic coordinator to see patient.   Tobacco abuse: He smokes about 1-1/2 packs per day 12 years. Discussed the importance of complete cessation  Snoring: likely has OSA, but sleep study can be deferred to outpatient setting.    Signed, Little IshikawaErin E Smith , NP 9:42 AM 05/20/2016 Pager [phone]  Patient seen and independently examined with Suzzette RighterErin Smith NP. We discussed all aspects of the encounter. I agree with the assessment and plan as stated above with minor modifications.  Patient has ruled out for MI.  Plan for nuclear stress test today.  With ? Syncopal episode and abnormality in V1 and V2 on EKG (?Brugada) will ask EP to see.  Needs outpt sleep study.  Triglycerides markedly elevated on lipids most likely contributed by untreated DM.  Will start Crestor 10mg  daily and fenofibrate 160mg  daily.   Signed: Armanda Magicraci Mindee Robledo, MD [facility] 05/20/2016

## 2016-05-20 NOTE — Progress Notes (Signed)
Text paged for a Nicotene patch for patient at his request and will place on him when available, awaiting further orders.

## 2016-05-20 NOTE — Progress Notes (Signed)
The patient was seen in nuclear medicine for a Lexiscan myoview. Originally planned for treadmill, but patient was not able to make it to target heart rate and had to stop due to fatigue.   STRESS IMAGES TO FOLLOW.    Suzzette RighterErin Samar Dass, NP  [facility]

## 2016-05-20 NOTE — Progress Notes (Signed)
Echocardiogram 2D Echocardiogram has been performed.  Joseph Krause, Joseph Krause 05/20/2016, 2:59 PM

## 2016-05-20 NOTE — Plan of Care (Signed)
Problem: Consults Goal: Tobacco Cessation referral if indicated Outcome: Progressing Got patient a Nicotene Patch and instructed on alternatives to help him to quit smoking and also the risks of smoking with his gender and family history placing him at a higher risk of heart problems. Patient is in denial at this time and doesn't want to quit smoking but is willing to wear the patch at this time, will continue to monitor.

## 2016-05-20 NOTE — Progress Notes (Signed)
Report received in patient's room via Jessica RN using SBAR format, reviewed orders, labs, VS, meds and patient's general condition, assumed care of patient. 

## 2016-05-20 NOTE — Plan of Care (Signed)
Problem: Consults Goal: Chest Pain Patient Education (See Patient Education module for education specifics.) Outcome: Progressing Patient given instructions regarding a stress test and what to expect, also gave him some written information regarding a stress test, what it is, risks and benefits and he will look at it with is wife later, has company at this time.

## 2016-05-20 NOTE — Progress Notes (Signed)
Blood pressure 127/100, HR 92, [id number]% after Hydralazine. Suzzette RighterErin Smith, PA aware. Pt awake and alert. In no distress

## 2016-05-21 ENCOUNTER — Other Ambulatory Visit: Payer: Self-pay | Admitting: Cardiology

## 2016-05-21 DIAGNOSIS — E785 Hyperlipidemia, unspecified: Secondary | ICD-10-CM | POA: Diagnosis not present

## 2016-05-21 DIAGNOSIS — R55 Syncope and collapse: Secondary | ICD-10-CM

## 2016-05-21 DIAGNOSIS — R9431 Abnormal electrocardiogram [ECG] [EKG]: Secondary | ICD-10-CM | POA: Diagnosis not present

## 2016-05-21 DIAGNOSIS — R079 Chest pain, unspecified: Secondary | ICD-10-CM | POA: Diagnosis not present

## 2016-05-21 LAB — NM MYOCAR MULTI W/SPECT W/WALL MOTION / EF
CHL CUP MPHR: 193 {beats}/min
CSEPEW: 1 METS
CSEPHR: 65 %
Peak HR: 126 {beats}/min
Rest HR: 86 {beats}/min

## 2016-05-21 MED ORDER — FENOFIBRATE 160 MG PO TABS: 160.0000 mg | ORAL_TABLET | Freq: Every day | ORAL | 12 refills | Status: AC

## 2016-05-21 MED ORDER — TECHNETIUM TC 99M TETROFOSMIN IV KIT
30.0000 | PACK | Freq: Once | INTRAVENOUS | Status: AC | PRN
  Administered 2016-05-20: 30 via INTRAVENOUS

## 2016-05-21 MED ORDER — TECHNETIUM TC 99M TETROFOSMIN IV KIT
30.0000 | PACK | Freq: Once | INTRAVENOUS | Status: AC | PRN
  Administered 2016-05-21: 30 via INTRAVENOUS

## 2016-05-21 MED ORDER — NITROGLYCERIN 0.4 MG SL SUBL: 0.4000 mg | SUBLINGUAL_TABLET | SUBLINGUAL | 2 refills | Status: AC | PRN

## 2016-05-21 MED ORDER — ROSUVASTATIN CALCIUM 10 MG PO TABS: 10.0000 mg | ORAL_TABLET | Freq: Every day | ORAL | 12 refills | Status: AC

## 2016-05-21 MED ORDER — METFORMIN HCL 500 MG PO TABS: 500.0000 mg | ORAL_TABLET | Freq: Two times a day (BID) | ORAL | 6 refills | Status: DC

## 2016-05-21 NOTE — Progress Notes (Signed)
SUBJECTIVE:  No complaints  OBJECTIVE:   Vitals:   Vitals:   05/20/16 1101 05/20/16 1355 05/20/16 2014 05/21/16 0521  BP: 119/71 121/60 125/73 117/65  Pulse:  86 92 70  Resp:  (!) 30 (!) 22 (!) 21  Temp:  98.6 F (37 C) 98.4 F (36.9 C) 98.1 F (36.7 C)  TempSrc:  Oral Oral Oral  SpO2:  98% 95% 97%  Weight:    299 lb 1.6 oz (135.7 kg)  Height:       I&O's:   Intake/Output Summary (Last 24 hours) at 05/21/16 [id number] Last data filed at 05/20/16 2118  Gross per 24 hour  Intake              460 ml  Output                0 ml  Net              460 ml   TELEMETRY: Reviewed telemetry pt in NSR:     PHYSICAL EXAM General: Well developed, well nourished, in no acute distress Head: Eyes PERRLA, No xanthomas.   Normal cephalic and atramatic  Lungs:   Clear bilaterally to auscultation and percussion. Heart:   HRRR S1 S2 Pulses are 2+ & equal. Abdomen: Bowel sounds are positive, abdomen soft and non-tender without masses Msk:  Back normal, normal gait. Normal strength and tone for age. Extremities:   No clubbing, cyanosis or edema.  DP +1 Neuro: Alert and oriented X 3. Psych:  Good affect, responds appropriately   LABS: Basic Metabolic Panel:  Recent Labs  96/01/5407/18/17 0840 05/19/16 1507 05/20/16 0157  NA 136  --  136  K 4.2  --  4.2  CL 103  --  104  CO2 25  --  27  GLUCOSE 217*  --  189*  BUN 11  --  11  CREATININE 0.71 0.79 0.67  CALCIUM 8.8*  --  9.1   Liver Function Tests:  Recent Labs  05/20/16 0157  AST 26  ALT 44  ALKPHOS 51  BILITOT 0.4  PROT 6.1*  ALBUMIN 3.2*   No results for input(s): LIPASE, AMYLASE in the last 72 hours. CBC:  Recent Labs  05/19/16 1507 05/20/16 0157  WBC 8.4 6.9  HGB 14.1 13.5  HCT 43.0 40.3  MCV 90.9 91.6  PLT 289 251   Cardiac Enzymes:  Recent Labs  05/19/16 1507 05/19/16 1957 05/20/16 0157  TROPONINI <0.03 <0.03 <0.03   BNP: Invalid input(s): POCBNP D-Dimer: No results for input(s): DDIMER in the last  72 hours. Hemoglobin A1C:  Recent Labs  05/19/16 1507  HGBA1C 8.2*   Fasting Lipid Panel:  Recent Labs  05/20/16 0157  CHOL 193  HDL 21*  LDLCALC UNABLE TO CALCULATE IF TRIGLYCERIDE OVER 400 mg/dL  TRIG [id number]*  CHOLHDL 9.2   Thyroid Function Tests:  Recent Labs  05/19/16 1507  TSH 2.568   Anemia Panel: No results for input(s): VITAMINB12, FOLATE, FERRITIN, TIBC, IRON, RETICCTPCT in the last 72 hours. Coag Panel:   Lab Results  Component Value Date   INR 0.93 05/20/2016   INR 0.98 05/19/2016    RADIOLOGY: Dg Chest 2 View  Result Date: 05/19/2016 CLINICAL DATA:  Chest pain. EXAM: CHEST  2 VIEW COMPARISON:  No recent prior. FINDINGS: Cardiomegaly with mild pulmonary vascular prominence. Mild interstitial edema cannot be excluded . Low lung volumes. No pleural effusion or pneumothorax. IMPRESSION: Cardiomegaly with mild pulmonary vascular prominence. Mild interstitial edema  cannot be excluded. Low lung volumes. Electronically Signed   By: Maisie Fushomas  Register   On: 05/19/2016 09:01    ASSESSMENT AND PLAN: Principal Problem:   Chest pain Active Problems:   Family history of early CAD   Hyperglycemia   Morbid obesity (HCC)   Tobacco abuse  Chest pain: with atypical and typical features. He says it is worse with exertion. He took aspirin at home and now chest pain has resolved completely. He does have many risk factors for CAD including a strong family history of early CAD, obesity, tobacco abuse and likely undiagnosed diabetes, HTN &HLD. Troponin negative 1. However chest x-ray shows some possible pulmonary vascular congestion and cardiomegaly. 2D echo showed low normal LVF with EF 50%.   Plan for exercise Myoview rest images today.  This is a 2 day study due to his morbid obesity.  Abnormal ECG: Initially there was some concern for Brugada syndrome. I reviewed ECG with Dr. Johney FrameAllred who says that it could potentially represent Brugada.  He apparently had an episode of ?  unresponsiveness for 2 hours but apparently never fully lost consiousness and was just staring and was standing while this occur and never fell down but does not remember anything.  Dr. Johney FrameAllred recommended 30 day heart monitor as an outpt to assess for any silent arrhythmias.  Presyncope: He had an episode of 2 hours of confusion and never fell down.  This is not consistent with syncope. The patient said he never fully lost conscious but doesn't remember anything. Continue to monitor on telemetry and as above.  Super morbid obesity: Body mass index is 43.22 kg/m.He says he cannot loose weight despite not eating a lot of calories. Will check a TSH.  Hyperglycemia: BG >200, likely an undiagnosed diabetic. HgA1c elevated at 8.2.  Will get Diabetic coordinator to see patient.   Tobacco abuse: He smokes about 1-1/2 packs per day 12 years. Discussed the importance of complete cessation  Snoring: likely has OSA, but sleep study can be deferred to outpatient setting.  Hyperlipidemia - TAGs are 694 likely secondary to poorly controlled DM.  Started Crestor and fenofibrate.      Armanda Magicraci Turner, MD  05/21/2016  9:14 AM

## 2016-05-21 NOTE — Discharge Summary (Signed)
Discharge Summary    Patient ID: Joseph HammingMark Mayeux,  MRN: [id number], DOB/AGE: 11/03/1985 30 y.o.  Admit date: 05/19/2016 Discharge date: 05/21/2016  Primary Care Provider: No PCP Per Patient Primary Cardiologist: New- Dr. Excell Seltzerooper  Discharge Diagnoses    Principal Problem:   Chest pain Active Problems:   Family history of early CAD   Hyperglycemia   Morbid obesity (HCC)   Tobacco abuse   Allergies No Known Allergies  Diagnostic Studies/Procedures  MYOCARDIAL IMAGING WITH SPECT (REST AND PHARMACOLOGIC-STRESS - 2 DAY PROTOCOL) 05/21/16  FINDINGS: Perfusion: Possible mild apical thinning with rest. With stress, a small area of mild reversibility is identified about the mid to base of the mid to anterior septal walls.  Wall Motion: Normal left ventricular wall motion. No left ventricular dilation.  Left Ventricular Ejection Fraction: 54 %  End diastolic volume 136 ml  End systolic volume 63 ml  IMPRESSION: 1. Small mild area of reversibility involving the mid to base of the mid to anterior septal wall.  2. Normal left ventricular wall motion.  3. Left ventricular ejection fraction 54%  4. Non invasive risk stratification*: Low  *2012 Appropriate Use Criteria for Coronary Revascularization Focused Update: J Am Coll Cardiol. 2012;59(9):857-881. http://content.dementiazones.comonlinejacc.org/article.aspx?articleid=1201161   _____________   History of Present Illness   Joseph Krause is a 30 y.o. male with a history of morbid obesity, tobacco abuse, asthma and family hx of early CAD who presented to [facility] on 05/19/2016 with chest pain. His ECG was noted to be abnormal and [facility] and work up.   His past medical history is remarkable for a appendectomy over 10 years ago complicated by pneumothorax.  He has otherwise been in okay health but does not ever see a primary care provider. He doesn't remember the last time he saw  Dr. He has always been overweight but feels like recently he is having a lot of trouble losing any weight no matter how little he eats.  He was in his usual state of health until last Wednesday, 05/17/2016, when he was at work as a Financial traderdirectional driller. Apparently while working, his coworkers witnessed him have a episode where he "went unresponsive". Patient is unclear if he passed out or not, but he does not remember any of this time.Marland Kitchen. Per his wife telling the nurse, he was out for 2 hours?Marland Kitchen. EMS was called to the scene and apparently his blood pressure was elevated with SBP~ 220 and a heart rate of 160. His heart rate then dropped to 110 when he stood up. EMS told him he was dehydrated and requested he come to the hospital, but the patient refused and continued on with work.  Since that time he's had ongoing chest pain. The chest pain is described as a pressure that comes and goes, but is worse with exertion. It was associated with SOB, but no diaphoresis or nausea. No radiation. On 05/19/15, it occurred all night as 5/10 constant chest pain. The next morning 05/19/16 his wife finally talked him into being seen. He took an aspirin at home and by the time he got to the ER [facility],  he had very dull chest pain. Currently he is chest pain-free.  In [facility], Chest x-ray showed cardiomegaly with mild pulmonary vascular congestion. Troponin and BNP normal. Labs notable only for hyperglycemia at 212.  Family history of early CAD but no sudden cardiac death.  Hospital Course  It was  felt initially that his EKG could represent Brugada syndrome. His EKG was reviewed with electrophysiology and set it could potentially represent Brugada. He will need outpatient follow-up with EP. Follow-up has been arranged. He will also need to wear 30 day event monitor to rule out any sort of silent arrhythmia.  He was [facility] for [facility]. This was a 2 day study due to his morbid obesity. Lexiscan Myoview report  above. Result was low risk and there was no concern for ischemia.  His triglycerides were elevated at 694.he was also diagnosed with diabetes this admission his A1c was 8.2. He was advised to follow with a primary care physician regarding his diabetes. Started the patient on 500 mg of metformin twice a day.   I spoke with the patient at length about his modifiable risk factors. I advised him that weight loss would improve his probable sleep apnea and his diabetes. His wife tells me that he snores and makes a panting noise at night when he breathes. He also endorses daytime somnolence and extreme fatigue. Smoking cessation was also encouraged.  He was seen today by Dr. Mayford Knife and deemed suitable for discharge. We will follow up with him in 2-3 weeks.  _____________  Discharge Vitals Blood pressure 122/76, pulse 78, temperature 98.4 F (36.9 C), temperature source Oral, resp. rate (!) 21, height 5\' 10"  (1.778 m), weight 299 lb 1.6 oz (135.7 kg), SpO2 96 %.  Filed Weights   05/19/16 1115 05/20/16 0616 05/21/16 0521  Weight: (!) 301 lb 3.2 oz (136.6 kg) (!) 302 lb 8 oz (137.2 kg) 299 lb 1.6 oz (135.7 kg)    Labs & Radiologic Studies     CBC  Recent Labs  05/19/16 1507 05/20/16 0157  WBC 8.4 6.9  HGB 14.1 13.5  HCT 43.0 40.3  MCV 90.9 91.6  PLT 289 251   Basic Metabolic Panel  Recent Labs  05/19/16 0840 05/19/16 1507 05/20/16 0157  NA 136  --  136  K 4.2  --  4.2  CL 103  --  104  CO2 25  --  27  GLUCOSE 217*  --  189*  BUN 11  --  11  CREATININE 0.71 0.79 0.67  CALCIUM 8.8*  --  9.1   Liver Function Tests  Recent Labs  05/20/16 0157  AST 26  ALT 44  ALKPHOS 51  BILITOT 0.4  PROT 6.1*  ALBUMIN 3.2*   Cardiac Enzymes  Recent Labs  05/19/16 1507 05/19/16 1957 05/20/16 0157  TROPONINI <0.03 <0.03 <0.03   Hemoglobin A1C  Recent Labs  05/19/16 1507  HGBA1C 8.2*   Fasting Lipid Panel  Recent Labs  05/20/16 0157  CHOL 193  HDL 21*  LDLCALC UNABLE  TO CALCULATE IF TRIGLYCERIDE OVER 400 mg/dL  TRIG 161*  CHOLHDL 9.2   Thyroid Function Tests  Recent Labs  05/19/16 1507  TSH 2.568    Dg Chest 2 View  Result Date: 05/19/2016 CLINICAL DATA:  Chest pain. EXAM: CHEST  2 VIEW COMPARISON:  No recent prior. FINDINGS: Cardiomegaly with mild pulmonary vascular prominence. Mild interstitial edema cannot be excluded . Low lung volumes. No pleural effusion or pneumothorax. IMPRESSION: Cardiomegaly with mild pulmonary vascular prominence. Mild interstitial edema cannot be excluded. Low lung volumes. Electronically Signed   By: Maisie Fus  Register   On: 05/19/2016 09:01   Nm Myocar Multi W/spect Izetta Dakin Motion / Ef  Result Date: 05/21/2016 CLINICAL DATA:  chest pain. Abnormal EKG. Smoker. Asthma. Obesity. EXAM: MYOCARDIAL IMAGING  WITH SPECT (REST AND PHARMACOLOGIC-STRESS - 2 DAY PROTOCOL) GATED LEFT VENTRICULAR WALL MOTION STUDY LEFT VENTRICULAR EJECTION FRACTION TECHNIQUE: Standard myocardial SPECT imaging was performed after resting intravenous injection of 30 mCi Tc-5937m tetrofosmin. Subsequently, on a second day, intravenous infusion of Lexiscan was performed under the supervision of the Cardiology staff. At peak effect of the drug, 30 mCi Tc-1337m tetrofosmin was injected intravenously and standard myocardial SPECT imaging was performed. Quantitative gated imaging was also performed to evaluate left ventricular wall motion, and estimate left ventricular ejection fraction. COMPARISON:  Chest radiograph 05/19/2016. FINDINGS: Perfusion: Possible mild apical thinning with rest. With stress, a small area of mild reversibility is identified about the mid to base of the mid to anterior septal walls. Wall Motion: Normal left ventricular wall motion. No left ventricular dilation. Left Ventricular Ejection Fraction: 54 % End diastolic volume 136 ml End systolic volume 63 ml IMPRESSION: 1. Small mild area of reversibility involving the mid to base of the mid to anterior  septal wall. 2. Normal left ventricular wall motion. 3. Left ventricular ejection fraction 54% 4. Non invasive risk stratification*: Low *2012 Appropriate Use Criteria for Coronary Revascularization Focused Update: J Am Coll Cardiol. 2012;59(9):857-881. http://content.dementiazones.comonlinejacc.org/article.aspx?articleid=1201161 Electronically Signed   By: Jeronimo GreavesKyle  Talbot M.D.   On: 05/21/2016 11:32    Disposition   Pt is being discharged home today in good condition.  Follow-up Plans & Appointments    Follow-up Information    [facility] [address] [facility] .   Specialty:  Cardiology Why:  Our office will call you to schedule an appt.  Contact information: [address], [address] [place] [id number] [phone]         Discharge Instructions    Diet - low sodium heart healthy    Complete by:  As directed   Increase activity slowly    Complete by:  As directed      Discharge Medications   Discharge Medication List as of 05/21/2016  4:10 PM    START taking these medications   Details  fenofibrate 160 MG tablet Take 1 tablet (160 mg total) by mouth daily., Starting Sun 05/21/2016, Normal    metFORMIN (GLUCOPHAGE) 500 MG tablet Take 1 tablet (500 mg total) by mouth 2 (two) times daily with a meal., Starting Sun 05/21/2016, Normal    nitroGLYCERIN (NITROSTAT) 0.4 MG SL tablet Place 1 tablet (0.4 mg total) under the tongue every 5 (five) minutes x 3 doses as needed for chest pain., Starting Sun 05/21/2016, Normal    rosuvastatin (CRESTOR) 10 MG tablet Take 1 tablet (10 mg total) by mouth daily at 6 PM., Starting Sun 05/21/2016, Normal      CONTINUE these medications which have NOT CHANGED   Details  aspirin-acetaminophen-caffeine (EXCEDRIN MIGRAINE) 250-250-65 MG tablet Take 2 tablets by mouth every 6 (six) hours as needed for headache., Historical Med      STOP taking these medications     aspirin EC 325 MG tablet            Outstanding Labs/Studies     Duration  of Discharge Encounter   Greater than 30 minutes including physician time.  Signed, Little IshikawaErin E Smith NP 05/21/2016, 5:07 PM

## 2016-05-22 ENCOUNTER — Telehealth: Payer: Self-pay | Admitting: Cardiology

## 2016-05-22 DIAGNOSIS — R0683 Snoring: Secondary | ICD-10-CM

## 2016-05-22 DIAGNOSIS — R079 Chest pain, unspecified: Secondary | ICD-10-CM

## 2016-05-22 DIAGNOSIS — R400 Somnolence: Secondary | ICD-10-CM

## 2016-05-22 NOTE — Telephone Encounter (Signed)
Calcium score and sleep study ordered for scheduling. Patient agrees with treatment plan.

## 2016-05-22 NOTE — Telephone Encounter (Signed)
Please set patient up for chest CT for calcium score to risk stratify and set up outpt sleep study to rule out sleep apnea

## 2016-05-22 NOTE — Addendum Note (Signed)
Addended by: Gunnar FusiKEMP, KATHRYN A on: 05/22/2016 04:41 PM   Modules accepted: Orders

## 2016-05-24 ENCOUNTER — Telehealth: Payer: Self-pay | Admitting: Cardiology

## 2016-05-24 NOTE — Telephone Encounter (Signed)
Spoke with patient to make the appointment for Cardiac Score - He

## 2016-05-24 NOTE — Telephone Encounter (Signed)
Spoke with patient to make the appointment for Cardiac Score and explain that it would be $150.00.  Wanted to schedule it for the same day he was coming in for the event monitor.  He stated that his insurance was not going to start until the 2nd week of September.  We cancel the monitor due to he didn't have insurance.   He is going to call us to reschedule the monitor and test once his insurance start.

## 2016-05-25 NOTE — Telephone Encounter (Signed)
Patient has insurance that will start on June 02, 2016.   I am going to call the sleep lab to schedule, patient is aware that we will make it a few weeks after that date to be sure that insurance has kicked in.     Patient is also aware that he will receive a packet of information in the mail from the sleep center letting him know the date of his study, and everything he needs to prepare for.

## 2016-07-05 ENCOUNTER — Institutional Professional Consult (permissible substitution): Payer: PRIVATE HEALTH INSURANCE | Admitting: Cardiology

## 2016-07-10 ENCOUNTER — Encounter (HOSPITAL_BASED_OUTPATIENT_CLINIC_OR_DEPARTMENT_OTHER): Payer: PRIVATE HEALTH INSURANCE

## 2016-07-19 ENCOUNTER — Telehealth: Payer: Self-pay | Admitting: Cardiology

## 2016-07-19 NOTE — Telephone Encounter (Signed)
Patient refused to make appt while in office because insurance didn't start till sept. Since then I have called pt several times to get him scheduled with no answer and no voicemail. I have removed the order from the workque. Please see notes below  07/19/2016 tried to call pt no answer no voicemail. stpegram  07/03/2016 Called pt no answer no voicemail. stpegram 06/15/2016 LMOM for pt to call and schedule monitor appt. stpegram 05-23-16 Cancel due to insurance will not start until after 06-02-16.  will call with in the insurance info and to make appointment for cardiac ct score also/saf

## 2016-08-10 ENCOUNTER — Ambulatory Visit (HOSPITAL_BASED_OUTPATIENT_CLINIC_OR_DEPARTMENT_OTHER): Payer: BLUE CROSS/BLUE SHIELD | Attending: Cardiology | Admitting: Cardiology

## 2016-08-10 VITALS — Ht 69.0 in | Wt 300.0 lb

## 2016-08-10 DIAGNOSIS — R0683 Snoring: Secondary | ICD-10-CM

## 2016-08-10 DIAGNOSIS — G4733 Obstructive sleep apnea (adult) (pediatric): Secondary | ICD-10-CM | POA: Insufficient documentation

## 2016-08-10 DIAGNOSIS — R400 Somnolence: Secondary | ICD-10-CM | POA: Diagnosis present

## 2016-08-28 NOTE — Procedures (Signed)
Patient Name: Joseph Krause, Crom Study Date: 08/10/2016 Gender: Male D.O.B: [birth year] Age (years): 30 Referring Provider: Fransico Him MD, ABSM Height (inches): 69 Interpreting Physician: Fransico Him MD, ABSM Weight (lbs): 300 RPSGT: Laren Everts BMI: 44 MRN: [id number] Neck Size: 17.50  CLINICAL INFORMATION Sleep Study Type: Split Night CPAP Indication for sleep study: Diabetes, Excessive Daytime Sleepiness, Fatigue, Obesity, Snoring Epworth Sleepiness Score: 18  SLEEP STUDY TECHNIQUE  As per the AASM Manual for the Scoring of Sleep and Associated Events v2.3 (April 2016) with a hypopnea requiring 4% desaturations. The channels recorded and monitored were frontal, central and occipital EEG, electrooculogram (EOG), submentalis EMG (chin), nasal and oral airflow, thoracic and abdominal wall motion, anterior tibialis EMG, snore microphone, electrocardiogram, and pulse oximetry. Continuous positive airway pressure (CPAP) was initiated when the patient met split night criteria and was titrated according to treat sleep-disordered breathing.  MEDICATIONS Medications self-administered by patient taken the night of the study : N/A  RESPIRATORY PARAMETERS Diagnostic Total AHI (/hr): 19.4  RDI (/hr):23.7   OA Index (/hr): -  CA Index (/hr): 0.0 REM AHI (/hr): 6.8  NREM AHI (/hr):23.0  Supine AHI (/hr):99.9  Non-supine AHI (/hr):4.67 Min O2 Sat (%):84.00  Mean O2 (%): 92.42  Time below 88% (min):3.9    Titration Optimal Pressure (cm):14  AHI at Optimal Pressure (/hr):0.0  Min O2 at Optimal Pressure (%):89.0 Supine % at Optimal (%):11  Sleep % at Optimal (%):95    SLEEP ARCHITECTURE The recording time for the entire night was 403.8 minutes. During a baseline period of 243.6 minutes, the patient slept for 197.6 minutes in REM and nonREM, yielding a sleep efficiency of 81.1%. Sleep onset after lights out was 14.0 minutes with a REM latency of 73.0 minutes. The patient spent 19.55% of  the night in stage N1 sleep, 50.85% in stage N2 sleep, 7.34% in stage N3 and 22.26% in REM. During the titration period of 154.1 minutes, the patient slept for 140.0 minutes in REM and nonREM, yielding a sleep efficiency of 90.8%. Sleep onset after CPAP initiation was 1.8 minutes with a REM latency of 52.5 minutes. The patient spent 15.00% of the night in stage N1 sleep, 63.57% in stage N2 sleep, 0.00% in stage N3 and 21.43% in REM.  CARDIAC DATA The 2 lead EKG demonstrated sinus rhythm. The mean heart rate was 68.51 beats per minute. Other EKG findings include: PVCs.  LEG MOVEMENT DATA The total Periodic Limb Movements of Sleep (PLMS) were 0. The PLMS index was 0.00 .  IMPRESSIONS - Moderate obstructive sleep apnea occurred during the diagnostic portion of the study(AHI = 19.4/hour). An optimal PAP pressure was selected for this patient ( 14 cm of water) - No significant central sleep apnea occurred during the diagnostic portion of the study (CAI = 0.0/hour). - Moderate oxygen desaturation was noted during the diagnostic portion of the study (Min O2 =84.00%). - The patient snored with Soft snoring volume during the diagnostic portion of the study. - EKG findings include PVCs. - Clinically significant periodic limb movements did not occur during sleep.  DIAGNOSIS - Obstructive Sleep Apnea (327.23 [G47.33 ICD-10])  RECOMMENDATIONS - Trial of CPAP therapy on 14 cm H2O with a Medium size Fisher&Paykel Full Face Mask Simplus mask and heated humidification. - Avoid alcohol, sedatives and other CNS depressants that may worsen sleep apnea and disrupt normal sleep architecture. - Sleep hygiene should be reviewed to assess factors that may improve sleep quality. - Weight management and regular exercise should be initiated  or continued. - Return to Sleep Center for re-evaluation after 10 weeks of therapy  [place], [place] [facility]  ELECTRONICALLY SIGNED ON:   08/28/2016, 5:58 PM [place] PH: [phone]   FX: [phone] [place]

## 2016-08-30 ENCOUNTER — Other Ambulatory Visit: Payer: Self-pay | Admitting: *Deleted

## 2016-10-19 ENCOUNTER — Encounter: Payer: Self-pay | Admitting: Cardiology

## 2016-11-01 ENCOUNTER — Ambulatory Visit: Payer: PRIVATE HEALTH INSURANCE | Admitting: Cardiology

## 2016-11-17 ENCOUNTER — Encounter: Payer: Self-pay | Admitting: *Deleted

## 2016-11-28 ENCOUNTER — Encounter: Payer: Self-pay | Admitting: Cardiology

## 2016-11-29 ENCOUNTER — Ambulatory Visit (INDEPENDENT_AMBULATORY_CARE_PROVIDER_SITE_OTHER): Payer: BLUE CROSS/BLUE SHIELD | Admitting: Cardiology

## 2016-11-29 ENCOUNTER — Encounter (INDEPENDENT_AMBULATORY_CARE_PROVIDER_SITE_OTHER): Payer: Self-pay

## 2016-11-29 ENCOUNTER — Ambulatory Visit: Payer: PRIVATE HEALTH INSURANCE | Admitting: Cardiology

## 2016-11-29 ENCOUNTER — Encounter: Payer: Self-pay | Admitting: Cardiology

## 2016-11-29 VITALS — BP 110/78 | HR 84 | Ht 70.0 in | Wt 312.0 lb

## 2016-11-29 DIAGNOSIS — R9431 Abnormal electrocardiogram [ECG] [EKG]: Secondary | ICD-10-CM | POA: Insufficient documentation

## 2016-11-29 DIAGNOSIS — Z9989 Dependence on other enabling machines and devices: Secondary | ICD-10-CM | POA: Diagnosis not present

## 2016-11-29 DIAGNOSIS — G4733 Obstructive sleep apnea (adult) (pediatric): Secondary | ICD-10-CM | POA: Diagnosis not present

## 2016-11-29 HISTORY — DX: Obstructive sleep apnea (adult) (pediatric): G47.33

## 2016-11-29 HISTORY — DX: Abnormal electrocardiogram (ECG) (EKG): R94.31

## 2016-11-29 MED ORDER — ESZOPICLONE 2 MG PO TABS: ORAL_TABLET | ORAL | 1 refills | Status: DC

## 2016-11-29 NOTE — Progress Notes (Signed)
w  Cardiology Office Note    Date:  11/30/2016   ID:  Joseph Krause, DOB [birth year], MRN [id number]  PCP:  No PCP Per Patient  Cardiologist:  Armanda Magic, MD   Chief Complaint  Patient presents with  . Sleep Apnea    History of Present Illness:  Joseph Krause is a 31 y.o. male with a history of morbid obesity, asthma, tobacco use who underwent sleep study due to excessive daytime sleepiness.  He was found to have moderate OSA with an AHI of 19/hr and underwent CPAP titration to 14cm H2O.  He is now here for followup. He tolerates the device well and has not problems when he uses it but he has problems with insomnia and so he does not sleep much.  He tries to go to bed by 10pm and is asleep by 10:05pm and then after 11pm is up and down all night. He is not sure why this occurs but has been like this for about 6 months. He was prescribed Trazadone but it did not help.  He tolerates the full face mask and feels the pressure is adequate.  Since going on CPAP he hasnot noticed any difference in his sleepiness level or fatigue because he is still not sleeping.  He does not snore when using CPAP.  He denies any significant mouth or nasal dryness or congestion.  He was seen in the hospital 6 months ago with CP and rule out for MI.  Nuclear stress test was low risk showing a small area of reversibility in the mid and base of the anteroseptal wall with normal LVF.  2D echo showed normal VLF with EF 50% and trivial MR/TR.  He was also noted to have an abnormal EKG with ? Brugada syndrome and it was recommended that he have a followup outpt event monitor but this was not done.  There is no family history of SCD.  He has had 2 more episodes of mental status changes while at work.  His wife says that his coworkers said he was completely out of it and eyes were open but not responding appropriately but he was standing up and no falling to the ground.  The paramedics felt his BS had dropped.  There was no syncope  noted.    Past Medical History:  Diagnosis Date  . Abnormal EKG 11/29/2016  . Asthma   . Family history of early CAD   . Hyperglycemia   . Morbid obesity (HCC)   . OSA on CPAP 11/29/2016   Moderate OSA with AHI 19/hr now on CPAP  . Pneumothorax   . Tobacco abuse     Past Surgical History:  Procedure Laterality Date  . APPENDECTOMY      Current Medications: Current Meds  Medication Sig  . fenofibrate 160 MG tablet Take 1 tablet (160 mg total) by mouth daily.  . metFORMIN (GLUCOPHAGE) 1000 MG tablet Take 1,000 mg by mouth 2 (two) times daily with a meal.   . nitroGLYCERIN (NITROSTAT) 0.4 MG SL tablet Place 1 tablet (0.4 mg total) under the tongue every 5 (five) minutes x 3 doses as needed for chest pain.  . Omega-3 Fatty Acids (FISH OIL) 1000 MG CAPS Take 1,000 mg by mouth 2 (two) times daily.  . rosuvastatin (CRESTOR) 10 MG tablet Take 1 tablet (10 mg total) by mouth daily at 6 PM.    Allergies:   Patient has no known allergies.   Social History   Social History  . Marital  status: Single    Spouse name: N/A  . Number of children: N/A  . Years of education: N/A   Social History Main Topics  . Smoking status: Current Some Day Smoker  . Smokeless tobacco: Never Used  . Alcohol use Yes  . Drug use: No  . Sexual activity: Yes    Birth control/ protection: Other-see comments   Other Topics Concern  . None   Social History Narrative  . None     Family History:  The patient's family history includes Heart attack in his father, paternal grandfather, and paternal uncle; Heart disease in his father, paternal grandfather, and paternal uncle; Heart failure in his paternal grandfather.   ROS:   Please see the history of present illness.    ROS All other systems reviewed and are negative.  No flowsheet data found.     PHYSICAL EXAM:   VS:  BP 110/78   Pulse 84   Ht 5\' 10"  (1.778 m)   Wt (!) 312 lb (141.5 kg)   BMI 44.77 kg/m    GEN: Well nourished, well  developed, in no acute distress  HEENT: normal  Neck: no JVD, carotid bruits, or masses Cardiac: RRR; no murmurs, rubs, or gallops,no edema.  Intact distal pulses bilaterally.  Respiratory:  clear to auscultation bilaterally, normal work of breathing GI: soft, nontender, nondistended, + BS MS: no deformity or atrophy  Skin: warm and dry, no rash Neuro:  Alert and Oriented x 3, Strength and sensation are intact Psych: euthymic mood, full affect  Wt Readings from Last 3 Encounters:  11/29/16 (!) 312 lb (141.5 kg)  08/10/16 300 lb (136.1 kg)  05/21/16 299 lb 1.6 oz (135.7 kg)      Studies/Labs Reviewed:   EKG:  EKG is not ordered today.    Recent Labs: 05/19/2016: B Natriuretic Peptide 10.1; TSH 2.568 05/20/2016: ALT 44; BUN 11; Creatinine, Ser 0.67; Hemoglobin 13.5; Platelets 251; Potassium 4.2; Sodium 136   Lipid Panel    Component Value Date/Time   CHOL 193 05/20/2016 0157   TRIG 694 (H) 05/20/2016 0157   HDL 21 (L) 05/20/2016 0157   CHOLHDL 9.2 05/20/2016 0157   VLDL UNABLE TO CALCULATE IF TRIGLYCERIDE OVER 400 mg/dL 40/98/1191 4782   LDLCALC UNABLE TO CALCULATE IF TRIGLYCERIDE OVER 400 mg/dL 95/62/1308 6578    Additional studies/ records that were reviewed today include:  none    ASSESSMENT:    1. OSA on CPAP   2. Morbid obesity (HCC)   3. Abnormal EKG      PLAN:  In order of problems listed above:  OSA - the patient is tolerating PAP therapy well without any problems. The PAP download was reviewed today and showed an AHI of 1/hr on 14 cm H2O with 80% compliance in using more than 4 hours nightly.  The patient has been using and benefiting from CPAP use and will continue to benefit from therapy.  Morbid obesity 3.   Abnormal EKG - there was some question in the hospital whether this could be a form of Brugada's syndrome.  He was supposed to get an event monitor but this was never done so I will order it today. His stress test showed a small mild area of  reversibility involving the mid to base of the mid to anterior septal wall with EF 54%.  He has not had any anginal symptoms and recommended coronary calcium score but he could not afford the $150 payment.  I have instructed him  to call if he develops chest pain or pressure with exertion, DOE or exertional fatigue.   4.   Insomnia - he has no problems getting to sleep but wakes up 1 hour later and then sleeps poorly the rest of the night.  His OSA is adequately treated so this is most consistent with sleep maintenance insomnia. I have recommended a trial of Lunesta 2mg  3 times week at bedtime.  We discussed the risk of dependence on this if he uses it more frequently. He will see me back in 3 months.    Medication Adjustments/Labs and Tests Ordered: Current medicines are reviewed at length with the patient today.  Concerns regarding medicines are outlined above.  Medication changes, Labs and Tests ordered today are listed in the Patient Instructions below.  Patient Instructions  Medication Instructions:  1) START LUNESTA 2 mg as needed for insomnia.   Labwork: None  Testing/Procedures: Your physician has recommended that you wear an event monitor. Event monitors are medical devices that record the heart's electrical activity. Doctors most often us these monitors to diagnose arrhythmias. Arrhythmias are problems with the speed or rhythm of the heartbeat. The monitor is a small, portable device. You can wear one while you do your normal daily activities. This is usually used to diagnose what is causing palpitations/syncope (passing out).  Follow-Up: Your physician recommends that you schedule a follow-up appointment in 3 months with Dr. Mayford Knifeurner.  Any Other Special Instructions Will Be Listed Below (If Applicable).     If you need a refill on your cardiac medications before your next appointment, please call your pharmacy.      Signed, Armanda Magicraci Anais Denslow, MD  11/30/2016 10:51 AM    [facility] [address], [place], [place]  [address] Phone: [phone]; Fax: [phone]

## 2016-11-29 NOTE — Patient Instructions (Signed)
Medication Instructions:  1) START LUNESTA 2 mg as needed for insomnia.   Labwork: None  Testing/Procedures: Your physician has recommended that you wear an event monitor. Event monitors are medical devices that record the heart's electrical activity. Doctors most often us these monitors to diagnose arrhythmias. Arrhythmias are problems with the speed or rhythm of the heartbeat. The monitor is a small, portable device. You can wear one while you do your normal daily activities. This is usually used to diagnose what is causing palpitations/syncope (passing out).  Follow-Up: Your physician recommends that you schedule a follow-up appointment in 3 months with Dr. Mayford Knifeurner.  Any Other Special Instructions Will Be Listed Below (If Applicable).     If you need a refill on your cardiac medications before your next appointment, please call your pharmacy.

## 2016-12-06 ENCOUNTER — Other Ambulatory Visit: Payer: Self-pay | Admitting: Cardiology

## 2016-12-06 DIAGNOSIS — R55 Syncope and collapse: Secondary | ICD-10-CM

## 2016-12-06 DIAGNOSIS — R9431 Abnormal electrocardiogram [ECG] [EKG]: Secondary | ICD-10-CM

## 2016-12-06 DIAGNOSIS — R404 Transient alteration of awareness: Secondary | ICD-10-CM

## 2016-12-07 ENCOUNTER — Ambulatory Visit (INDEPENDENT_AMBULATORY_CARE_PROVIDER_SITE_OTHER): Payer: BLUE CROSS/BLUE SHIELD

## 2016-12-07 DIAGNOSIS — R55 Syncope and collapse: Secondary | ICD-10-CM

## 2016-12-07 DIAGNOSIS — R404 Transient alteration of awareness: Secondary | ICD-10-CM | POA: Diagnosis not present

## 2016-12-07 DIAGNOSIS — R9431 Abnormal electrocardiogram [ECG] [EKG]: Secondary | ICD-10-CM

## 2017-01-03 ENCOUNTER — Telehealth: Payer: Self-pay | Admitting: *Deleted

## 2017-01-03 NOTE — Telephone Encounter (Signed)
Late Entry:  A fax came today on 12/27/2016 from  [facility] [place] [facility] for this patient as proof that they have authorized his CPAP DEVICE with a status of Certified in Total meaning in network benefits will be applied. Authoriztion number is [id number]. Date of service is December 27, 2016

## 2017-02-05 ENCOUNTER — Encounter: Payer: Self-pay | Admitting: Internal Medicine

## 2017-02-20 ENCOUNTER — Ambulatory Visit (INDEPENDENT_AMBULATORY_CARE_PROVIDER_SITE_OTHER): Payer: BLUE CROSS/BLUE SHIELD | Admitting: Internal Medicine

## 2017-02-20 ENCOUNTER — Encounter: Payer: Self-pay | Admitting: Internal Medicine

## 2017-02-20 VITALS — BP 124/72 | HR 76 | Ht 69.0 in | Wt 304.0 lb

## 2017-02-20 DIAGNOSIS — R55 Syncope and collapse: Secondary | ICD-10-CM | POA: Diagnosis not present

## 2017-02-20 DIAGNOSIS — I498 Other specified cardiac arrhythmias: Secondary | ICD-10-CM

## 2017-02-20 NOTE — Progress Notes (Signed)
HPI [name] is referred today by Dr. Mayford Knife for evaluation of syncope. He is a pleasant 31 yo man who looks older than his stated age who has an abnormal ECG with ST elevation in V2. The patient has a strong family h/o CAD with his father and grandfather both having prior MI's. The patient does not have angina. He has minimal sob. He has undergone a 2D echo. He has had no documented VT or SVT. His syncope has occurred twice and not associated with a warning and he has not had a loss of bowel or bladder continence and no tongue biting. No Known Allergies   Current Outpatient Prescriptions  Medication Sig Dispense Refill  . fenofibrate 160 MG tablet Take 1 tablet (160 mg total) by mouth daily. 30 tablet 12  . glipiZIDE (GLUCOTROL XL) 5 MG 24 hr tablet Take 5 mg by mouth daily.    . metFORMIN (GLUCOPHAGE) 1000 MG tablet Take 1,000 mg by mouth 2 (two) times daily with a meal.   2  . nitroGLYCERIN (NITROSTAT) 0.4 MG SL tablet Place 1 tablet (0.4 mg total) under the tongue every 5 (five) minutes x 3 doses as needed for chest pain. 25 tablet 2  . Omega-3 Fatty Acids (FISH OIL) 1000 MG CAPS Take 1,000 mg by mouth 2 (two) times daily.    . rosuvastatin (CRESTOR) 10 MG tablet Take 1 tablet (10 mg total) by mouth daily at 6 PM. 30 tablet 12   No current facility-administered medications for this visit.      Past Medical History:  Diagnosis Date  . Abnormal EKG 11/29/2016  . Asthma   . Family history of early CAD   . Hyperglycemia   . Morbid obesity (HCC)   . OSA on CPAP 11/29/2016   Moderate OSA with AHI 19/hr now on CPAP  . Pneumothorax   . Tobacco abuse     ROS:   All systems reviewed and negative except as noted in the HPI.   Past Surgical History:  Procedure Laterality Date  . APPENDECTOMY       Family History  Problem Relation Age of Onset  . Heart attack Father   . Heart disease Father   . Heart attack Paternal Uncle   . Heart disease Paternal Uncle   . Heart  attack Paternal Grandfather   . Heart disease Paternal Grandfather   . Heart failure Paternal Grandfather      Social History   Social History  . Marital status: Single    Spouse name: N/A  . Number of children: N/A  . Years of education: N/A   Occupational History  . Not on file.   Social History Main Topics  . Smoking status: Current Some Day Smoker  . Smokeless tobacco: Never Used  . Alcohol use Yes  . Drug use: No  . Sexual activity: Yes    Birth control/ protection: Other-see comments   Other Topics Concern  . Not on file   Social History Narrative  . No narrative on file     BP 124/72   Pulse 76   Ht 5\' 9"  (1.753 m)   Wt (!) 304 lb (137.9 kg)   SpO2 95%   BMI 44.89 kg/m   Physical Exam:  Well appearing 31 yo man who looks older than his stated age, NAD HEENT: Unremarkable Neck:  No JVD, no thyromegally Lymphatics:  No adenopathy Back:  No CVA tenderness Lungs:  Clear with no wheezes HEART:  Regular rate rhythm, no murmurs, no rubs, no clicks Abd:  soft, positive bowel sounds, no organomegally, no rebound, no guarding Ext:  2 plus pulses, no edema, no cyanosis, no clubbing Skin:  No rashes no nodules Neuro:  CN II through XII intact, motor grossly intact  EKG - NSR with NSIVCD and first degree AV block   Assess/Plan: 1. Syncope - the etiology is unclear and I have recommended he undergo insertion of an ILR. 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 We will try to better understand his arrhythmias if any once he has worn the iLR. 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 I am not clear as to the etiology.  3. Tobacco abuse - I have very strongly recommended that he stop smoking.  4. Obesity - I have strongly encouraged the patient to lose weight.  He admits to some dietary indiscretion.   Leonia ReevesGregg Sundiata Ferrick,M.D.

## 2017-02-20 NOTE — Patient Instructions (Addendum)
Medication Instructions:  Your physician recommends that you continue on your current medications as directed. Please refer to the Current Medication list given to you today.   Labwork: None Ordered   Testing/Procedures: LINQ Implant   Follow-Up:  7-10 day after procedure for wound check in the Device Clinic and in 3 months with Dr. Ladona Ridgelaylor.    Any Other Special Instructions Will Be Listed Below (If Applicable).  [facility] the [facility]tr[facility]  [facility] on 03/02/17 at 4:00 PM  You may eat and drink normally prior to procedure  You may drive yourself home after the procedure    If you need a refill on your cardiac medications before your next appointment, please call your pharmacy.

## 2017-02-27 ENCOUNTER — Ambulatory Visit: Payer: BLUE CROSS/BLUE SHIELD | Admitting: Cardiology

## 2017-03-02 ENCOUNTER — Encounter (HOSPITAL_COMMUNITY)
Admission: RE | Disposition: A | Discharge status: Home / Self Care | Payer: Self-pay | Source: Ambulatory Visit | Attending: Internal Medicine

## 2017-03-02 ENCOUNTER — Ambulatory Visit (HOSPITAL_COMMUNITY)
Admission: RE | Admit: 2017-03-02 | Discharge: 2017-03-02 | Disposition: A | Discharge status: Home / Self Care | Payer: BLUE CROSS/BLUE SHIELD | Source: Ambulatory Visit | Attending: Internal Medicine | Admitting: Internal Medicine

## 2017-03-02 DIAGNOSIS — Z8249 Family history of ischemic heart disease and other diseases of the circulatory system: Secondary | ICD-10-CM | POA: Diagnosis not present

## 2017-03-02 DIAGNOSIS — G4733 Obstructive sleep apnea (adult) (pediatric): Secondary | ICD-10-CM | POA: Diagnosis not present

## 2017-03-02 DIAGNOSIS — Z7984 Long term (current) use of oral hypoglycemic drugs: Secondary | ICD-10-CM | POA: Diagnosis not present

## 2017-03-02 DIAGNOSIS — R739 Hyperglycemia, unspecified: Secondary | ICD-10-CM | POA: Diagnosis not present

## 2017-03-02 DIAGNOSIS — R9431 Abnormal electrocardiogram [ECG] [EKG]: Secondary | ICD-10-CM | POA: Diagnosis not present

## 2017-03-02 DIAGNOSIS — Z6841 Body Mass Index (BMI) 40.0 and over, adult: Secondary | ICD-10-CM | POA: Insufficient documentation

## 2017-03-02 DIAGNOSIS — F17210 Nicotine dependence, cigarettes, uncomplicated: Secondary | ICD-10-CM | POA: Diagnosis not present

## 2017-03-02 DIAGNOSIS — R55 Syncope and collapse: Secondary | ICD-10-CM | POA: Diagnosis not present

## 2017-03-02 DIAGNOSIS — I498 Other specified cardiac arrhythmias: Secondary | ICD-10-CM | POA: Insufficient documentation

## 2017-03-02 DIAGNOSIS — J45909 Unspecified asthma, uncomplicated: Secondary | ICD-10-CM | POA: Insufficient documentation

## 2017-03-02 HISTORY — PX: LOOP RECORDER INSERTION: EP1214

## 2017-03-02 SURGERY — LOOP RECORDER INSERTION

## 2017-03-02 MED ORDER — LIDOCAINE-EPINEPHRINE 1 %-1:100000 IJ SOLN
INTRAMUSCULAR | Status: AC
  Filled 2017-03-02: qty 1

## 2017-03-02 MED ORDER — LIDOCAINE-EPINEPHRINE 1 %-1:100000 IJ SOLN
INTRAMUSCULAR | Status: DC | PRN
  Administered 2017-03-02: 10 mL

## 2017-03-02 SURGICAL SUPPLY — 2 items
LOOP REVEAL LINQSYS (Prosthesis & Implant Heart) ×3 IMPLANT
PACK LOOP INSERTION (CUSTOM PROCEDURE TRAY) ×3 IMPLANT

## 2017-03-02 NOTE — Interval H&P Note (Signed)
History and Physical Interval Note:  03/02/2017 5:32 PM  Katha HammingMark Torgeson  has presented today for surgery, with the diagnosis of Brugada Syndrome  The various methods of treatment have been discussed with the patient and family. After consideration of risks, benefits and other options for treatment, the patient has consented to  Procedure(s): Loop Recorder Insertion (N/A) as a surgical intervention .  The patient's history has been reviewed, patient examined, no change in status, stable for surgery.  I have reviewed the patient's chart and labs.  Questions were answered to the patient's satisfaction.     Lewayne BuntingGregg Alaiya Martindelcampo

## 2017-03-02 NOTE — H&P (View-Only) (Signed)
HPI Joseph Krause is referred today by Dr. Mayford Knife for evaluation of syncope. He is a pleasant 31 yo man who looks older than his stated age who has an abnormal ECG with ST elevation in V2. The patient has a strong family h/o CAD with his father and grandfather both having prior MI's. The patient does not have angina. He has minimal sob. He has undergone a 2D echo. He has had no documented VT or SVT. His syncope has occurred twice and not associated with a warning and he has not had a loss of bowel or bladder continence and no tongue biting. No Known Allergies   Current Outpatient Prescriptions  Medication Sig Dispense Refill  . fenofibrate 160 MG tablet Take 1 tablet (160 mg total) by mouth daily. 30 tablet 12  . glipiZIDE (GLUCOTROL XL) 5 MG 24 hr tablet Take 5 mg by mouth daily.    . metFORMIN (GLUCOPHAGE) 1000 MG tablet Take 1,000 mg by mouth 2 (two) times daily with a meal.   2  . nitroGLYCERIN (NITROSTAT) 0.4 MG SL tablet Place 1 tablet (0.4 mg total) under the tongue every 5 (five) minutes x 3 doses as needed for chest pain. 25 tablet 2  . Omega-3 Fatty Acids (FISH OIL) 1000 MG CAPS Take 1,000 mg by mouth 2 (two) times daily.    . rosuvastatin (CRESTOR) 10 MG tablet Take 1 tablet (10 mg total) by mouth daily at 6 PM. 30 tablet 12   No current facility-administered medications for this visit.      Past Medical History:  Diagnosis Date  . Abnormal EKG 11/29/2016  . Asthma   . Family history of early CAD   . Hyperglycemia   . Morbid obesity (HCC)   . OSA on CPAP 11/29/2016   Moderate OSA with AHI 19/hr now on CPAP  . Pneumothorax   . Tobacco abuse     ROS:   All systems reviewed and negative except as noted in the HPI.   Past Surgical History:  Procedure Laterality Date  . APPENDECTOMY       Family History  Problem Relation Age of Onset  . Heart attack Father   . Heart disease Father   . Heart attack Paternal Uncle   . Heart disease Paternal Uncle   . Heart  attack Paternal Grandfather   . Heart disease Paternal Grandfather   . Heart failure Paternal Grandfather      Social History   Social History  . Marital status: Single    Spouse name: N/A  . Number of children: N/A  . Years of education: N/A   Occupational History  . Not on file.   Social History Main Topics  . Smoking status: Current Some Day Smoker  . Smokeless tobacco: Never Used  . Alcohol use Yes  . Drug use: No  . Sexual activity: Yes    Birth control/ protection: Other-see comments   Other Topics Concern  . Not on file   Social History Narrative  . No narrative on file     BP 124/72   Pulse 76   Ht 5\' 9"  (1.753 m)   Wt (!) 304 lb (137.9 kg)   SpO2 95%   BMI 44.89 kg/m   Physical Exam:  Well appearing 31 yo man who looks older than his stated age, NAD HEENT: Unremarkable Neck:  No JVD, no thyromegally Lymphatics:  No adenopathy Back:  No CVA tenderness Lungs:  Clear with no wheezes HEART:  Regular rate rhythm, no murmurs, no rubs, no clicks Abd:  soft, positive bowel sounds, no organomegally, no rebound, no guarding Ext:  2 plus pulses, no edema, no cyanosis, no clubbing Skin:  No rashes no nodules Neuro:  CN II through XII intact, motor grossly intact  EKG - NSR with NSIVCD and first degree AV block   Assess/Plan: 1. Syncope - the etiology is unclear and I have recommended he undergo insertion of an ILR. 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 We will try to better understand his arrhythmias if any once he has worn the iLR. 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 I am not clear as to the etiology.  3. Tobacco abuse - I have very strongly recommended that he stop smoking.  4. Obesity - I have strongly encouraged the patient to lose weight.  He admits to some dietary indiscretion.   Leonia ReevesGregg Forney Kleinpeter,M.D.

## 2017-03-05 ENCOUNTER — Encounter (HOSPITAL_COMMUNITY): Payer: Self-pay | Admitting: Internal Medicine

## 2017-03-05 ENCOUNTER — Telehealth: Payer: Self-pay | Admitting: Cardiology

## 2017-03-05 NOTE — Telephone Encounter (Signed)
Patient's fiance called stating that he had an implantable loop recorder placed on Friday and they were instructed to pull off the top bandage today.  She says that the bandage is stuck to the steri strip and to the skin and she says when she tried to pull off the bandage it was pulling the incision as well.  Instructed her to keep bandage on tonight and call in am if it still will not come off.

## 2017-03-06 ENCOUNTER — Telehealth: Payer: Self-pay | Admitting: Cardiology

## 2017-03-06 NOTE — Telephone Encounter (Signed)
Spoke to patient's fiance regarding patient's dressing. Fiance states that when she tried to remove patient's tegaderm dressing the steri strips started to come off with it. I told fiance that patient would need to keep the steri strips in place for at least 7 days to keep the incision edges approximated. I told her that I could see patient in the office and remove the top layer and apply new steri strips. Fiance said that patient wouldn't be able to come into the office d/t work. I told fiance that he could leave everything in place until appt if preferred. Fiance verbalized understanding and states that she will talk to him and call back if he would like to come in for an appt.

## 2017-03-06 NOTE — Telephone Encounter (Signed)
Pt fiance called and stated that patient badages and steri strips was coming off. She wanted to know what she should do. Informed pt fiance that I would have a Device Clinic RN call her. Pt fiance verbalized understanding.

## 2017-03-12 ENCOUNTER — Ambulatory Visit (INDEPENDENT_AMBULATORY_CARE_PROVIDER_SITE_OTHER): Payer: Self-pay | Admitting: *Deleted

## 2017-03-12 DIAGNOSIS — R55 Syncope and collapse: Secondary | ICD-10-CM

## 2017-03-12 LAB — CUP PACEART INCLINIC DEVICE CHECK
Implantable Pulse Generator Implant Date: 20180601
MDC IDC SESS DTM: 20180611114732

## 2017-03-12 NOTE — Progress Notes (Signed)
Wound check appointment. Steri-strips removed. Wound without redness or edema. Incision edges approximated, wound well healed. Normal device function. Battery status: good. R-waves 0.4612mV. No symptom, tachy, pause, brady, or AF episodes. Monthly summary reports and ROV with GT on 06/11/17.

## 2017-03-12 NOTE — Patient Instructions (Signed)
If you ever use your symptom activator for dizziness or passing out and you don't go to the emergency room, please call the Device Clinic at [phone] to tell us what your symptoms were.  We may request a manual transmission from your home monitor at this time for review of the episode.

## 2017-04-02 ENCOUNTER — Ambulatory Visit (INDEPENDENT_AMBULATORY_CARE_PROVIDER_SITE_OTHER): Payer: BLUE CROSS/BLUE SHIELD | Admitting: *Deleted

## 2017-04-02 DIAGNOSIS — R55 Syncope and collapse: Secondary | ICD-10-CM | POA: Diagnosis not present

## 2017-04-02 NOTE — Progress Notes (Signed)
Carelink Summary Report / Loop Recorder 

## 2017-04-12 LAB — CUP PACEART REMOTE DEVICE CHECK
MDC IDC PG IMPLANT DT: 20180601
MDC IDC SESS DTM: 20180701213646

## 2017-05-01 ENCOUNTER — Ambulatory Visit (INDEPENDENT_AMBULATORY_CARE_PROVIDER_SITE_OTHER): Payer: BLUE CROSS/BLUE SHIELD | Admitting: *Deleted

## 2017-05-01 DIAGNOSIS — R55 Syncope and collapse: Secondary | ICD-10-CM | POA: Diagnosis not present

## 2017-05-02 NOTE — Progress Notes (Signed)
Carelink Summary Report / Loop Recorder 

## 2017-05-14 LAB — CUP PACEART REMOTE DEVICE CHECK
MDC IDC PG IMPLANT DT: 20180601
MDC IDC SESS DTM: 20180731220615

## 2017-05-18 ENCOUNTER — Encounter: Payer: Self-pay | Admitting: *Deleted

## 2017-05-31 ENCOUNTER — Ambulatory Visit (INDEPENDENT_AMBULATORY_CARE_PROVIDER_SITE_OTHER): Payer: BLUE CROSS/BLUE SHIELD | Admitting: *Deleted

## 2017-05-31 DIAGNOSIS — R55 Syncope and collapse: Secondary | ICD-10-CM

## 2017-05-31 LAB — CUP PACEART REMOTE DEVICE CHECK
Date Time Interrogation Session: 20180830223611
MDC IDC PG IMPLANT DT: 20180601

## 2017-06-01 NOTE — Progress Notes (Signed)
Carelink Summary Report / Loop Recorder 

## 2017-06-01 NOTE — Progress Notes (Signed)
Carelink summary report received. Battery status OK. Normal device function. No new symptom episodes, tachy episodes, brady, or pause episodes. No new AF episodes. Monthly summary reports and ROV/PRN 

## 2017-06-11 ENCOUNTER — Ambulatory Visit (INDEPENDENT_AMBULATORY_CARE_PROVIDER_SITE_OTHER): Payer: BLUE CROSS/BLUE SHIELD | Admitting: Internal Medicine

## 2017-06-11 ENCOUNTER — Encounter: Payer: Self-pay | Admitting: Internal Medicine

## 2017-06-11 VITALS — BP 143/98 | HR 96 | Ht 69.0 in | Wt 310.4 lb

## 2017-06-11 DIAGNOSIS — R55 Syncope and collapse: Secondary | ICD-10-CM

## 2017-06-11 LAB — CUP PACEART INCLINIC DEVICE CHECK
Date Time Interrogation Session: 20180910103532
Implantable Pulse Generator Implant Date: 20180601

## 2017-06-11 MED ORDER — AMLODIPINE BESYLATE 5 MG PO TABS: 5.0000 mg | ORAL_TABLET | Freq: Every day | ORAL | 3 refills | Status: AC

## 2017-06-11 NOTE — Patient Instructions (Addendum)
Medication Instructions:  Your physician has recommended you make the following change in your medication:  1.  Start taking amlodipine 5 mg (one tablet) by mouth daily.   Labwork: None ordered.  Testing/Procedures: None ordered.  Follow-Up: Your physician wants you to follow-up in: 6 months with Dr. Ladona Ridgel.   You will receive a reminder letter in the mail two months in advance. If you don't receive a letter, please call our office to schedule the follow-up appointment.  Any Other Special Instructions Will Be Listed Below (If Applicable).  1.  Try to reduce the sodium in your diet. 2.  Try to lose weight.   Low-Sodium Eating Plan Sodium, which is an element that makes up salt, helps you maintain a healthy balance of fluids in your body. Too much sodium can increase your blood pressure and cause fluid and waste to be held in your body. Your health care provider or dietitian may recommend following this plan if you have high blood pressure (hypertension), kidney disease, liver disease, or heart failure. Eating less sodium can help lower your blood pressure, reduce swelling, and protect your heart, liver, and kidneys. What are tips for following this plan? General guidelines  Most people on this plan should limit their sodium intake to 1,500-2,000 mg (milligrams) of sodium each day. Reading food labels  The Nutrition Facts label lists the amount of sodium in one serving of the food. If you eat more than one serving, you must multiply the listed amount of sodium by the number of servings.  Choose foods with less than 140 mg of sodium per serving.  Avoid foods with 300 mg of sodium or more per serving. Shopping  Look for lower-sodium products, often labeled as "low-sodium" or "no salt added."  Always check the sodium content even if foods are labeled as "unsalted" or "no salt added".  Buy fresh foods. ? Avoid canned foods and premade or frozen meals. ? Avoid canned, cured, or  processed meats  Buy breads that have less than 80 mg of sodium per slice. Cooking  Eat more home-cooked food and less restaurant, buffet, and fast food.  Avoid adding salt when cooking. Use salt-free seasonings or herbs instead of table salt or sea salt. Check with your health care provider or pharmacist before using salt substitutes.  Cook with plant-based oils, such as canola, sunflower, or olive oil. Meal planning  When eating at a restaurant, ask that your food be prepared with less salt or no salt, if possible.  Avoid foods that contain MSG (monosodium glutamate). MSG is sometimes added to [place] food, bouillon, and some canned foods. What foods are recommended? The items listed may not be a complete list. Talk with your dietitian about what dietary choices are best for you. Grains Low-sodium cereals, including oats, puffed wheat and rice, and shredded wheat. Low-sodium crackers. Unsalted rice. Unsalted pasta. Low-sodium bread. Whole-grain breads and whole-grain pasta. Vegetables Fresh or frozen vegetables. "No salt added" canned vegetables. "No salt added" tomato sauce and paste. Low-sodium or reduced-sodium tomato and vegetable juice. Fruits Fresh, frozen, or canned fruit. Fruit juice. Meats and other protein foods Fresh or frozen (no salt added) meat, poultry, seafood, and fish. Low-sodium canned tuna and salmon. Unsalted nuts. Dried peas, beans, and lentils without added salt. Unsalted canned beans. Eggs. Unsalted nut butters. Dairy Milk. Soy milk. Cheese that is naturally low in sodium, such as ricotta cheese, fresh mozzarella, or Swiss cheese Low-sodium or reduced-sodium cheese. Cream cheese. Yogurt. Fats and oils Unsalted  butter. Unsalted margarine with no trans fat. Vegetable oils such as canola or olive oils. Seasonings and other foods Fresh and dried herbs and spices. Salt-free seasonings. Low-sodium mustard and ketchup. Sodium-free salad dressing. Sodium-free light  mayonnaise. Fresh or refrigerated horseradish. Lemon juice. Vinegar. Homemade, reduced-sodium, or low-sodium soups. Unsalted popcorn and pretzels. Low-salt or salt-free chips. What foods are not recommended? The items listed may not be a complete list. Talk with your dietitian about what dietary choices are best for you. Grains Instant hot cereals. Bread stuffing, pancake, and biscuit mixes. Croutons. Seasoned rice or pasta mixes. Noodle soup cups. Boxed or frozen macaroni and cheese. Regular salted crackers. Self-rising flour. Vegetables Sauerkraut, pickled vegetables, and relishes. Olives. [place] fries. Onion rings. Regular canned vegetables (not low-sodium or reduced-sodium). Regular canned tomato sauce and paste (not low-sodium or reduced-sodium). Regular tomato and vegetable juice (not low-sodium or reduced-sodium). Frozen vegetables in sauces. Meats and other protein foods Meat or fish that is salted, canned, smoked, spiced, or pickled. Bacon, ham, sausage, hotdogs, corned beef, chipped beef, packaged lunch meats, salt pork, jerky, pickled herring, anchovies, regular canned tuna, sardines, salted nuts. Dairy Processed cheese and cheese spreads. Cheese curds. Blue cheese. Feta cheese. String cheese. Regular cottage cheese. Buttermilk. Canned milk. Fats and oils Salted butter. Regular margarine. Ghee. Bacon fat. Seasonings and other foods Onion salt, garlic salt, seasoned salt, table salt, and sea salt. Canned and packaged gravies. Worcestershire sauce. Tartar sauce. Barbecue sauce. Teriyaki sauce. Soy sauce, including reduced-sodium. Steak sauce. Fish sauce. Oyster sauce. Cocktail sauce. Horseradish that you find on the shelf. Regular ketchup and mustard. Meat flavorings and tenderizers. Bouillon cubes. Hot sauce and Tabasco sauce. Premade or packaged marinades. Premade or packaged taco seasonings. Relishes. Regular salad dressings. Salsa. Potato and tortilla chips. Corn chips and puffs. Salted  popcorn and pretzels. Canned or dried soups. Pizza. Frozen entrees and pot pies. Summary  Eating less sodium can help lower your blood pressure, reduce swelling, and protect your heart, liver, and kidneys.  Most people on this plan should limit their sodium intake to 1,500-2,000 mg (milligrams) of sodium each day.  Canned, boxed, and frozen foods are high in sodium. Restaurant foods, fast foods, and pizza are also very high in sodium. You also get sodium by adding salt to food.  Try to cook at home, eat more fresh fruits and vegetables, and eat less fast food, canned, processed, or prepared foods. This information is not intended to replace advice given to you by your health care provider. Make sure you discuss any questions you have with your health care provider. Document Released: 03/10/2002 Document Revised: 09/11/2016 Document Reviewed: 09/11/2016 Elsevier Interactive Patient Education  2017 [employer]   Calorie Counting for [employer] Calories are units of energy. Your body needs a certain amount of calories from food to keep you going throughout the day. When you eat more calories than your body needs, your body stores the extra calories as fat. When you eat fewer calories than your body needs, your body burns fat to get the energy it needs. Calorie counting means keeping track of how many calories you eat and drink each day. Calorie counting can be helpful if you need to lose weight. If you make sure to eat fewer calories than your body needs, you should lose weight. Ask your health care provider what a healthy weight is for you. For calorie counting to work, you will need to eat the right number of calories in a day in order to  lose a healthy amount of weight per week. A dietitian can help you determine how many calories you need in a day and will give you suggestions on how to reach your calorie goal.  A healthy amount of weight to lose per week is usually 1-2 lb (0.5-0.9 kg). This  usually means that your daily calorie intake should be reduced by 500-750 calories.  Eating 1,200 - 1,500 calories per day can help most women lose weight.  Eating 1,500 - 1,800 calories per day can help most men lose weight.  What is my plan? My goal is to have __________ calories per day. If I have this many calories per day, I should lose around __________ pounds per week. What do I need to know about calorie counting? In order to meet your daily calorie goal, you will need to:  Find out how many calories are in each food you would like to eat. Try to do this before you eat.  Decide how much of the food you plan to eat.  Write down what you ate and how many calories it had. Doing this is called keeping a food log.  To successfully lose weight, it is important to balance calorie counting with a healthy lifestyle that includes regular activity. Aim for 150 minutes of moderate exercise (such as walking) or 75 minutes of vigorous exercise (such as running) each week. Where do I find calorie information?  The number of calories in a food can be found on a Nutrition Facts label. If a food does not have a Nutrition Facts label, try to look up the calories online or ask your dietitian for help. Remember that calories are listed per serving. If you choose to have more than one serving of a food, you will have to multiply the calories per serving by the amount of servings you plan to eat. For example, the label on a package of bread might say that a serving size is 1 slice and that there are 90 calories in a serving. If you eat 1 slice, you will have eaten 90 calories. If you eat 2 slices, you will have eaten 180 calories. How do I keep a food log? Immediately after each meal, record the following information in your food log:  What you ate. Don't forget to include toppings, sauces, and other extras on the food.  How much you ate. This can be measured in cups, ounces, or number of items.  How  many calories each food and drink had.  The total number of calories in the meal.  Keep your food log near you, such as in a small notebook in your pocket, or use a mobile app or website. Some programs will calculate calories for you and show you how many calories you have left for the day to meet your goal. What are some calorie counting tips?  Use your calories on foods and drinks that will fill you up and not leave you hungry: ? Some examples of foods that fill you up are nuts and nut butters, vegetables, lean proteins, and high-fiber foods like whole grains. High-fiber foods are foods with more than 5 g fiber per serving. ? Drinks such as sodas, specialty coffee drinks, alcohol, and juices have a lot of calories, yet do not fill you up.  Eat nutritious foods and avoid empty calories. Empty calories are calories you get from foods or beverages that do not have many vitamins or protein, such as candy, sweets, and soda. It is  better to have a nutritious high-calorie food (such as an avocado) than a food with few nutrients (such as a bag of chips).  Know how many calories are in the foods you eat most often. This will help you calculate calorie counts faster.  Pay attention to calories in drinks. Low-calorie drinks include water and unsweetened drinks.  Pay attention to nutrition labels for "low fat" or "fat free" foods. These foods sometimes have the same amount of calories or more calories than the full fat versions. They also often have added sugar, starch, or salt, to make up for flavor that was removed with the fat.  Find a way of tracking calories that works for you. Get creative. Try different apps or programs if writing down calories does not work for you. What are some portion control tips?  Know how many calories are in a serving. This will help you know how many servings of a certain food you can have.  Use a measuring cup to measure serving sizes. You could also try weighing out  portions on a kitchen scale. With time, you will be able to estimate serving sizes for some foods.  Take some time to put servings of different foods on your favorite plates, bowls, and cups so you know what a serving looks like.  Try not to eat straight from a bag or box. Doing this can lead to overeating. Put the amount you would like to eat in a cup or on a plate to make sure you are eating the right portion.  Use smaller plates, glasses, and bowls to prevent overeating.  Try not to multitask (for example, watch TV or use your computer) while eating. If it is time to eat, sit down at a table and enjoy your food. This will help you to know when you are full. It will also help you to be aware of what you are eating and how much you are eating. What are tips for following this plan? Reading food labels  Check the calorie count compared to the serving size. The serving size may be smaller than what you are used to eating.  Check the source of the calories. Make sure the food you are eating is high in vitamins and protein and low in saturated and trans fats. Shopping  Read nutrition labels while you shop. This will help you make healthy decisions before you decide to purchase your food.  Make a grocery list and stick to it. Cooking  Try to cook your favorite foods in a healthier way. For example, try baking instead of frying.  Use low-fat dairy products. Meal planning  Use more fruits and vegetables. Half of your plate should be fruits and vegetables.  Include lean proteins like poultry and fish. How do I count calories when eating out?  Ask for smaller portion sizes.  Consider sharing an entree and sides instead of getting your own entree.  If you get your own entree, eat only half. Ask for a box at the beginning of your meal and put the rest of your entree in it so you are not tempted to eat it.  If calories are listed on the menu, choose the lower calorie options.  Choose  dishes that include vegetables, fruits, whole grains, low-fat dairy products, and lean protein.  Choose items that are boiled, broiled, grilled, or steamed. Stay away from items that are buttered, battered, fried, or served with cream sauce. Items labeled "crispy" are usually fried, unless stated otherwise.  Choose water, low-fat milk, unsweetened iced tea, or other drinks without added sugar. If you want an alcoholic beverage, choose a lower calorie option such as a glass of wine or light beer.  Ask for dressings, sauces, and syrups on the side. These are usually high in calories, so you should limit the amount you eat.  If you want a salad, choose a garden salad and ask for grilled meats. Avoid extra toppings like bacon, cheese, or fried items. Ask for the dressing on the side, or ask for olive oil and vinegar or lemon to use as dressing.  Estimate how many servings of a food you are given. For example, a serving of cooked rice is  cup or about the size of half a baseball. Knowing serving sizes will help you be aware of how much food you are eating at restaurants. The list below tells you how big or small some common portion sizes are based on everyday objects: ? 1 oz-4 stacked dice. ? 3 oz-1 deck of cards. ? 1 tsp-1 die. ? 1 Tbsp- a ping-pong ball. ? 2 Tbsp-1 ping-pong ball. ?  cup- baseball. ? 1 cup-1 baseball. Summary  Calorie counting means keeping track of how many calories you eat and drink each day. If you eat fewer calories than your body needs, you should lose weight.  A healthy amount of weight to lose per week is usually 1-2 lb (0.5-0.9 kg). This usually means reducing your daily calorie intake by 500-750 calories.  The number of calories in a food can be found on a Nutrition Facts label. If a food does not have a Nutrition Facts label, try to look up the calories online or ask your dietitian for help.  Use your calories on foods and drinks that will fill you up, and not on  foods and drinks that will leave you hungry.  Use smaller plates, glasses, and bowls to prevent overeating. This information is not intended to replace advice given to you by your health care provider. Make sure you discuss any questions you have with your health care provider. Document Released: 09/18/2005 Document Revised: 08/18/2016 Document Reviewed: 08/18/2016 Elsevier Interactive Patient Education  2017 [employer].     If you need a refill on your cardiac medications before your next appointment, please call your pharmacy.

## 2017-06-11 NOTE — Progress Notes (Signed)
HPI Mr. Matos returns today for follow-up. He is a 31 year old man with a history of unexplained syncope, who underwent insertion of an implantable loop recorder approximately 3 months ago. In the interim, he has had no syncope. He denies chest pain or shortness of breath. He admits to dietary indiscretion. He is working regularly. No Known Allergies   Current Outpatient Prescriptions  Medication Sig Dispense Refill  . acetaminophen (TYLENOL) 500 MG tablet Take 1,000 mg by mouth daily as needed for moderate pain or headache.    . calcium carbonate (TUMS EX) 750 MG chewable tablet Chew 1 tablet by mouth daily as needed for heartburn.    . fenofibrate 160 MG tablet Take 1 tablet (160 mg total) by mouth daily. 30 tablet 12  . glipiZIDE (GLUCOTROL XL) 5 MG 24 hr tablet Take 5 mg by mouth daily.    . Melatonin 5 MG TABS Take 10 mg by mouth at bedtime as needed (sleep).    . metFORMIN (GLUCOPHAGE) 1000 MG tablet Take 1,000 mg by mouth 2 (two) times daily with a meal.   2  . nitroGLYCERIN (NITROSTAT) 0.4 MG SL tablet Place 1 tablet (0.4 mg total) under the tongue every 5 (five) minutes x 3 doses as needed for chest pain. 25 tablet 2  . nortriptyline (PAMELOR) 25 MG capsule Take s by mouth at bedtime for 7 days, then take s daily at night for 7 days, then take 75 mgs nightly    . Omega-3 Fatty Acids (FISH OIL) 1000 MG CAPS Take 1,000 mg by mouth 2 (two) times daily.    . rosuvastatin (CRESTOR) 10 MG tablet Take 1 tablet (10 mg total) by mouth daily at 6 PM. 30 tablet 12   No current facility-administered medications for this visit.      Past Medical History:  Diagnosis Date  . Abnormal EKG 11/29/2016  . Asthma   . Family history of early CAD   . Hyperglycemia   . Morbid obesity (HCC)   . OSA on CPAP 11/29/2016   Moderate OSA with AHI 19/hr now on CPAP  . Pneumothorax   . Tobacco abuse     ROS:   All systems reviewed and negative except as noted in the HPI.   Past  Surgical History:  Procedure Laterality Date  . APPENDECTOMY    . LOOP RECORDER INSERTION N/A 03/02/2017   Procedure: Loop Recorder Insertion;  Surgeon: Marinus Maw, MD;  Location: [facility];  Service: Cardiovascular;  Laterality: N/A;     Family History  Problem Relation Age of Onset  . Heart attack Father   . Heart disease Father   . Heart attack Paternal Uncle   . Heart disease Paternal Uncle   . Heart attack Paternal Grandfather   . Heart disease Paternal Grandfather   . Heart failure Paternal Grandfather      Social History   Social History  . Marital status: Single    Spouse name: N/A  . Number of children: N/A  . Years of education: N/A   Occupational History  . Not on file.   Social History Main Topics  . Smoking status: Current Some Day Smoker  . Smokeless tobacco: Never Used  . Alcohol use Yes  . Drug use: No  . Sexual activity: Yes    Birth control/ protection: Other-see comments   Other Topics Concern  . Not on file   Social History Narrative  . No narrative on file  Ht 5\' 9"  (1.753 m)   Physical Exam:  Stable appearing [id number] year old man, obese, NAD HEENT: Unremarkable Neck:  6 cm JVD, no thyromegally Lymphatics:  No adenopathy Back:  No CVA tenderness Lungs:  Clear, with no wheezes, rales, or rhonchi HEART:  Regular rate rhythm, no murmurs, no rubs, no clicks Abd:  soft, obese, positive bowel sounds, no organomegally, no rebound, no guarding Ext:  2 plus pulses, no edema, no cyanosis, no clubbing Skin:  No rashes no nodules Neuro:  CN II through XII intact, motor grossly intact   DEVICE  Normal device function.  See PaceArt for details. No arrhythmias  Assess/Plan: 1. Unexplained syncope - the etiology is still unknown. He is status post insertion of an implantable loop recorder approximately 3 months ago. He will undergo watchful waiting. 2. Hypertension - his blood pressure is elevated today. He admits to sodium  indiscretion. I've asked the patient start amlodipine 5 mg daily. 3. Obesity - he weighs 310 pounds. I've asked him to lose 10 pounds before his next visit in 6 months. 4. Dyslipidemia - he will continue statin therapy.  Lewayne BuntingGregg Elisavet Buehrer, M.D.

## 2017-07-02 ENCOUNTER — Ambulatory Visit (INDEPENDENT_AMBULATORY_CARE_PROVIDER_SITE_OTHER): Payer: BLUE CROSS/BLUE SHIELD | Admitting: *Deleted

## 2017-07-02 DIAGNOSIS — R55 Syncope and collapse: Secondary | ICD-10-CM | POA: Diagnosis not present

## 2017-07-03 NOTE — Progress Notes (Signed)
Carelink Summary Report / Loop Recorder 

## 2017-07-04 LAB — CUP PACEART REMOTE DEVICE CHECK
MDC IDC PG IMPLANT DT: 20180601
MDC IDC SESS DTM: 20180929234022

## 2017-07-30 ENCOUNTER — Ambulatory Visit (INDEPENDENT_AMBULATORY_CARE_PROVIDER_SITE_OTHER): Payer: BLUE CROSS/BLUE SHIELD | Admitting: *Deleted

## 2017-07-30 DIAGNOSIS — R55 Syncope and collapse: Secondary | ICD-10-CM

## 2017-07-31 NOTE — Progress Notes (Signed)
Carelink Summary Report / Loop Recorder 

## 2017-08-02 LAB — CUP PACEART REMOTE DEVICE CHECK
MDC IDC PG IMPLANT DT: 20180601
MDC IDC SESS DTM: 20181030000723

## 2017-08-27 ENCOUNTER — Encounter: Payer: BLUE CROSS/BLUE SHIELD | Admitting: *Deleted

## 2017-08-30 ENCOUNTER — Ambulatory Visit (INDEPENDENT_AMBULATORY_CARE_PROVIDER_SITE_OTHER): Payer: BLUE CROSS/BLUE SHIELD | Admitting: *Deleted

## 2017-08-30 DIAGNOSIS — R55 Syncope and collapse: Secondary | ICD-10-CM

## 2017-08-30 NOTE — Progress Notes (Signed)
Carelink Summary Report / Loop Recorder 

## 2017-09-11 LAB — CUP PACEART REMOTE DEVICE CHECK
Date Time Interrogation Session: 20181129003852
Implantable Pulse Generator Implant Date: 20180601

## 2017-09-28 ENCOUNTER — Ambulatory Visit (INDEPENDENT_AMBULATORY_CARE_PROVIDER_SITE_OTHER): Payer: BLUE CROSS/BLUE SHIELD | Admitting: *Deleted

## 2017-09-28 DIAGNOSIS — R55 Syncope and collapse: Secondary | ICD-10-CM | POA: Diagnosis not present

## 2017-10-01 NOTE — Progress Notes (Signed)
Carelink Summary Report / Loop Recorder 

## 2017-10-08 LAB — CUP PACEART REMOTE DEVICE CHECK
Implantable Pulse Generator Implant Date: 20180601
MDC IDC SESS DTM: 20181229003613

## 2017-10-29 ENCOUNTER — Ambulatory Visit (INDEPENDENT_AMBULATORY_CARE_PROVIDER_SITE_OTHER): Payer: Self-pay | Admitting: *Deleted

## 2017-10-29 DIAGNOSIS — R55 Syncope and collapse: Secondary | ICD-10-CM

## 2017-10-29 NOTE — Progress Notes (Signed)
Carelink Summary Report / Loop Recorder 

## 2017-11-08 LAB — CUP PACEART REMOTE DEVICE CHECK
Date Time Interrogation Session: 20190128010715
Implantable Pulse Generator Implant Date: 20180601

## 2017-11-30 ENCOUNTER — Ambulatory Visit (INDEPENDENT_AMBULATORY_CARE_PROVIDER_SITE_OTHER): Payer: Self-pay | Admitting: *Deleted

## 2017-11-30 DIAGNOSIS — R55 Syncope and collapse: Secondary | ICD-10-CM

## 2017-12-03 NOTE — Progress Notes (Signed)
Carelink Summary Report / Loop Recorder 

## 2018-01-02 ENCOUNTER — Ambulatory Visit (INDEPENDENT_AMBULATORY_CARE_PROVIDER_SITE_OTHER): Payer: Self-pay | Admitting: *Deleted

## 2018-01-02 DIAGNOSIS — R55 Syncope and collapse: Secondary | ICD-10-CM

## 2018-01-03 NOTE — Progress Notes (Signed)
Carelink Summary Report / Loop Recorder 

## 2018-01-07 LAB — CUP PACEART REMOTE DEVICE CHECK
Date Time Interrogation Session: 20190302010529
MDC IDC PG IMPLANT DT: 20180601

## 2018-02-04 ENCOUNTER — Ambulatory Visit (INDEPENDENT_AMBULATORY_CARE_PROVIDER_SITE_OTHER): Payer: Self-pay | Admitting: *Deleted

## 2018-02-04 DIAGNOSIS — R55 Syncope and collapse: Secondary | ICD-10-CM

## 2018-02-05 NOTE — Progress Notes (Signed)
Carelink Summary Report / Loop Recorder 

## 2018-02-06 LAB — CUP PACEART REMOTE DEVICE CHECK
Date Time Interrogation Session: 20190404020919
Implantable Pulse Generator Implant Date: 20180601

## 2018-02-26 LAB — CUP PACEART REMOTE DEVICE CHECK
Date Time Interrogation Session: 20190507023521
MDC IDC PG IMPLANT DT: 20180601

## 2018-03-11 ENCOUNTER — Ambulatory Visit (INDEPENDENT_AMBULATORY_CARE_PROVIDER_SITE_OTHER): Payer: Self-pay | Admitting: *Deleted

## 2018-03-11 DIAGNOSIS — R55 Syncope and collapse: Secondary | ICD-10-CM

## 2018-03-11 NOTE — Progress Notes (Signed)
Carelink Summary Report / Loop Recorder 

## 2018-04-11 ENCOUNTER — Ambulatory Visit (INDEPENDENT_AMBULATORY_CARE_PROVIDER_SITE_OTHER): Payer: Self-pay | Admitting: *Deleted

## 2018-04-11 DIAGNOSIS — R55 Syncope and collapse: Secondary | ICD-10-CM

## 2018-04-12 NOTE — Progress Notes (Signed)
Carelink Summary Report / Loop Recorder 

## 2018-04-16 LAB — CUP PACEART REMOTE DEVICE CHECK
Implantable Pulse Generator Implant Date: 20180601
MDC IDC SESS DTM: 20190609024005

## 2018-04-18 ENCOUNTER — Telehealth: Payer: Self-pay

## 2018-04-18 NOTE — Telephone Encounter (Signed)
Attempted to confirm remote transmission with pt. No answer and was unable to leave a message.   

## 2018-04-23 ENCOUNTER — Encounter: Payer: Self-pay | Admitting: Cardiology

## 2018-05-14 ENCOUNTER — Ambulatory Visit (INDEPENDENT_AMBULATORY_CARE_PROVIDER_SITE_OTHER): Payer: Self-pay | Admitting: *Deleted

## 2018-05-14 DIAGNOSIS — R55 Syncope and collapse: Secondary | ICD-10-CM

## 2018-05-15 NOTE — Progress Notes (Signed)
Carelink Summary Report / Loop Recorder 

## 2018-05-22 LAB — CUP PACEART REMOTE DEVICE CHECK
Date Time Interrogation Session: 20190712023915
Implantable Pulse Generator Implant Date: 20180601

## 2018-06-04 ENCOUNTER — Other Ambulatory Visit: Payer: Self-pay

## 2018-06-04 ENCOUNTER — Emergency Department (HOSPITAL_BASED_OUTPATIENT_CLINIC_OR_DEPARTMENT_OTHER)
Admission: EM | Admit: 2018-06-04 | Discharge: 2018-06-04 | Disposition: A | Discharge status: Home / Self Care | Payer: Self-pay | Attending: Emergency Medicine | Admitting: Emergency Medicine

## 2018-06-04 ENCOUNTER — Emergency Department (HOSPITAL_BASED_OUTPATIENT_CLINIC_OR_DEPARTMENT_OTHER): Payer: Self-pay

## 2018-06-04 ENCOUNTER — Encounter (HOSPITAL_BASED_OUTPATIENT_CLINIC_OR_DEPARTMENT_OTHER): Payer: Self-pay | Admitting: *Deleted

## 2018-06-04 DIAGNOSIS — J40 Bronchitis, not specified as acute or chronic: Secondary | ICD-10-CM | POA: Insufficient documentation

## 2018-06-04 DIAGNOSIS — F17200 Nicotine dependence, unspecified, uncomplicated: Secondary | ICD-10-CM | POA: Insufficient documentation

## 2018-06-04 DIAGNOSIS — Z95818 Presence of other cardiac implants and grafts: Secondary | ICD-10-CM | POA: Insufficient documentation

## 2018-06-04 DIAGNOSIS — Z79899 Other long term (current) drug therapy: Secondary | ICD-10-CM | POA: Insufficient documentation

## 2018-06-04 DIAGNOSIS — I10 Essential (primary) hypertension: Secondary | ICD-10-CM | POA: Insufficient documentation

## 2018-06-04 DIAGNOSIS — R739 Hyperglycemia, unspecified: Secondary | ICD-10-CM | POA: Insufficient documentation

## 2018-06-04 DIAGNOSIS — J45909 Unspecified asthma, uncomplicated: Secondary | ICD-10-CM | POA: Insufficient documentation

## 2018-06-04 DIAGNOSIS — Z7984 Long term (current) use of oral hypoglycemic drugs: Secondary | ICD-10-CM | POA: Insufficient documentation

## 2018-06-04 DIAGNOSIS — E1165 Type 2 diabetes mellitus with hyperglycemia: Secondary | ICD-10-CM | POA: Insufficient documentation

## 2018-06-04 DIAGNOSIS — R0602 Shortness of breath: Secondary | ICD-10-CM | POA: Insufficient documentation

## 2018-06-04 HISTORY — DX: Essential (primary) hypertension: I10

## 2018-06-04 HISTORY — DX: Other specified cardiac arrhythmias: I49.8

## 2018-06-04 HISTORY — DX: Type 2 diabetes mellitus without complications: E11.9

## 2018-06-04 LAB — CBC WITH DIFFERENTIAL/PLATELET
BASOS ABS: 0 10*3/uL (ref 0.0–0.1)
Basophils Relative: 0 %
Eosinophils Absolute: 0.1 10*3/uL (ref 0.0–0.7)
Eosinophils Relative: 1 %
HEMATOCRIT: 45.5 % (ref 39.0–52.0)
Hemoglobin: 15.9 g/dL (ref 13.0–17.0)
LYMPHS PCT: 26 %
Lymphs Abs: 2.3 10*3/uL (ref 0.7–4.0)
MCH: 30.6 pg (ref 26.0–34.0)
MCHC: 34.9 g/dL (ref 30.0–36.0)
MCV: 87.5 fL (ref 78.0–100.0)
Monocytes Absolute: 0.7 10*3/uL (ref 0.1–1.0)
Monocytes Relative: 8 %
Neutro Abs: 5.8 10*3/uL (ref 1.7–7.7)
Neutrophils Relative %: 65 %
PLATELETS: 307 10*3/uL (ref 150–400)
RBC: 5.2 MIL/uL (ref 4.22–5.81)
RDW: 13.3 % (ref 11.5–15.5)
WBC: 9 10*3/uL (ref 4.0–10.5)

## 2018-06-04 LAB — BASIC METABOLIC PANEL
Anion gap: 14 (ref 5–15)
BUN: 13 mg/dL (ref 6–20)
CO2: 23 mmol/L (ref 22–32)
Calcium: 9.1 mg/dL (ref 8.9–10.3)
Chloride: 98 mmol/L (ref 98–111)
Creatinine, Ser: 0.73 mg/dL (ref 0.61–1.24)
GFR calc Af Amer: >60 mL/min (ref >60)
Glucose, Bld: 329 mg/dL — ABNORMAL HIGH (ref 70–99)
Potassium: 4.2 mmol/L (ref 3.5–5.1)
SODIUM: 135 mmol/L (ref 135–145)

## 2018-06-04 LAB — TROPONIN I: Troponin I: <0.03 ng/mL (ref <0.03)

## 2018-06-04 LAB — CBG MONITORING, ED: GLUCOSE-CAPILLARY: 180 mg/dL — AB (ref 70–99)

## 2018-06-04 LAB — D-DIMER, QUANTITATIVE: D-Dimer, Quant: 0.3 ug/mL-FEU (ref 0.00–0.50)

## 2018-06-04 MED ORDER — METFORMIN HCL 500 MG PO TABS
500.0000 mg | ORAL_TABLET | Freq: Once | ORAL | Status: AC
  Administered 2018-06-04: 500 mg via ORAL
  Filled 2018-06-04: qty 1

## 2018-06-04 MED ORDER — METFORMIN HCL 500 MG PO TABS: 500.0000 mg | ORAL_TABLET | Freq: Two times a day (BID) | ORAL | 0 refills | Status: AC

## 2018-06-04 MED ORDER — IPRATROPIUM-ALBUTEROL 0.5-2.5 (3) MG/3ML IN SOLN
3.0000 mL | Freq: Once | RESPIRATORY_TRACT | Status: AC
  Administered 2018-06-04: 3 mL via RESPIRATORY_TRACT
  Filled 2018-06-04: qty 3

## 2018-06-04 MED ORDER — SODIUM CHLORIDE 0.9 % IV BOLUS
1000.0000 mL | Freq: Once | INTRAVENOUS | Status: AC
  Administered 2018-06-04: 1000 mL via INTRAVENOUS

## 2018-06-04 MED ORDER — ALBUTEROL SULFATE HFA 108 (90 BASE) MCG/ACT IN AERS
2.0000 | INHALATION_SPRAY | Freq: Once | RESPIRATORY_TRACT | Status: AC
  Administered 2018-06-04: 2 via RESPIRATORY_TRACT
  Filled 2018-06-04: qty 6.7

## 2018-06-04 NOTE — ED Provider Notes (Signed)
[facility] EMERGENCY DEPARTMENT Provider Note   CSN: [id number] Arrival date & time: 06/04/18  1344     History   Chief Complaint Chief Complaint  Patient presents with  . Shortness of Breath    HPI Joseph Krause is a 32 y.o. male.  Joseph Krause is a 32 y.o. Male with a history of Brugada syndrome, diabetes, hypertension, asthma and sleep apnea, who presents to the emergency department for evaluation of shortness of breath and productive cough for the past month.  Patient reports he has intermittently felt short of breath, or felt chest tightness when trying to take a deep breath for the past month, he has had worsening cough associated with this, no rhinorrhea, congestion, or sore throat.  Patient denies fevers or chills.  No associated chest pain.  Shortness of breath is not worse with exertion.  Patient denies any lower extremity swelling or pain, no recent hospitalizations or surgeries and no long distance travel, no prior history of PE or DVT.  He has been using his son's nebulizer machine and over-the-counter cough medications without improvement and is concerned that symptoms are still persisting.  Patient reports he has been without his CPAP or regular medications due to losing insurance.  Has been diagnosed with Brugada, has a loop recorder device in place and is followed by Dr. Ladona Ridgel with cardiology.     Past Medical History:  Diagnosis Date  . Abnormal EKG 11/29/2016  . Asthma   . Brugada syndrome   . Diabetes mellitus without complication (HCC)   . Family history of early CAD   . Hyperglycemia   . Hypertension   . Morbid obesity (HCC)   . OSA on CPAP 11/29/2016   Moderate OSA with AHI 19/hr now on CPAP  . Pneumothorax   . Tobacco abuse     Patient Active Problem List   Diagnosis Date Noted  . Syncope 03/02/2017  . OSA on CPAP 11/29/2016  . Abnormal EKG 11/29/2016  . Chest pain 05/19/2016  . Family history of early CAD   . Hyperglycemia   . Morbid  obesity (HCC)   . Tobacco abuse     Past Surgical History:  Procedure Laterality Date  . APPENDECTOMY    . LOOP RECORDER INSERTION N/A 03/02/2017   Procedure: Loop Recorder Insertion;  Surgeon: Marinus Maw, MD;  Location: [facility] INVASIVE CV LAB;  Service: Cardiovascular;  Laterality: N/A;        Home Medications    Prior to Admission medications   Medication Sig Start Date End Date Taking? Authorizing Provider  acetaminophen (TYLENOL) 500 MG tablet Take 1,000 mg by mouth daily as needed for moderate pain or headache.    [provider]  amLODipine (NORVASC) 5 MG tablet Take 1 tablet (5 mg total) by mouth daily. 06/11/17 09/09/17  Marinus Maw, MD  Calcium Carbonate Antacid (ALKA-SELTZER ANTACID PO) Take 1 tablet by mouth daily as needed.    [provider]  fenofibrate 160 MG tablet Take 1 tablet (160 mg total) by mouth daily. 05/21/16   Little Ishikawa, NP  glipiZIDE (GLUCOTROL XL) 5 MG 24 hr tablet Take 5 mg by mouth daily. 12/27/16   [provider]  metFORMIN (GLUCOPHAGE) 500 MG tablet Take 1 tablet (500 mg total) by mouth 2 (two) times daily with a meal. 06/04/18   Dartha Lodge, PA-C  nitroGLYCERIN (NITROSTAT) 0.4 MG SL tablet Place 1 tablet (0.4 mg total) under the tongue every 5 (five) minutes x  3 doses as needed for chest pain. 05/21/16   Little Ishikawa, NP  nortriptyline (PAMELOR) 25 MG capsule Take 25mg s by mouth at bedtime for 7 days, then take 50mg s daily at night for 7 days, then take 75 mgs nightly 02/28/17   [provider]  Omega-3 Fatty Acids (FISH OIL) 1000 MG CAPS Take 1,000 mg by mouth 2 (two) times daily.    [provider]  rosuvastatin (CRESTOR) 10 MG tablet Take 1 tablet (10 mg total) by mouth daily at 6 PM. 05/21/16   Little Ishikawa, NP    Family History Family History  Problem Relation Age of Onset  . Heart attack Father   . Heart disease Father   . Heart attack Paternal Uncle   . Heart disease Paternal Uncle   . Heart  attack Paternal Grandfather   . Heart disease Paternal Grandfather   . Heart failure Paternal Grandfather     Social History Social History   Tobacco Use  . Smoking status: Current Some Day Smoker  . Smokeless tobacco: Never Used  Substance Use Topics  . Alcohol use: Yes  . Drug use: No     Allergies   Patient has no known allergies.   Review of Systems Review of Systems  Constitutional: Negative for chills and fever.  HENT: Negative for congestion, rhinorrhea and sore throat.   Respiratory: Positive for cough, shortness of breath and wheezing. Negative for chest tightness.   Cardiovascular: Negative for chest pain.  Gastrointestinal: Negative for abdominal pain, nausea and vomiting.  Genitourinary: Negative for dysuria and frequency.  Musculoskeletal: Negative for arthralgias and myalgias.  Skin: Negative for color change and rash.  Neurological: Negative for dizziness, syncope, light-headedness and headaches.  All other systems reviewed and are negative.    Physical Exam Updated Vital Signs BP 124/79 (BP Location: Left Arm)   Pulse (!) 112   Temp 99.3 F (37.4 C) (Oral)   Resp 20   Ht 5\' 10"  (1.778 m)   Wt 133.8 kg   SpO2 100%   BMI 42.33 kg/m   Physical Exam  Constitutional: He is oriented to person, place, and time. He appears well-developed and well-nourished.  Non-toxic appearance. He does not appear ill. No distress.  HENT:  Head: Normocephalic and atraumatic.  Mouth/Throat: Oropharynx is clear and moist.  TMs clear with good landmarks, minimal nasal mucosa edema with clear rhinorrhea, posterior oropharynx clear and moist, with some erythema, no edema or exudates  Eyes: Right eye exhibits no discharge. Left eye exhibits no discharge.  Neck: Neck supple.  Cardiovascular: Normal rate, regular rhythm, normal heart sounds and intact distal pulses. Exam reveals no gallop and no friction rub.  No murmur heard. Pulmonary/Chest: Effort normal and breath  sounds normal. No stridor. No respiratory distress. He has no rales. He exhibits no tenderness.  Respirations equal and unlabored, patient able to speak in full sentences, lungs clear to auscultation bilaterally, chest wall nontender to palpation.  Abdominal: Soft. Bowel sounds are normal. He exhibits no distension and no mass. There is no tenderness. There is no guarding.  Abdomen soft, nondistended, nontender to palpation in all quadrants without guarding or peritoneal signs  Musculoskeletal: He exhibits no edema or deformity.  Bilateral lower extremities without tenderness or edema.  Neurological: He is alert and oriented to person, place, and time. Coordination normal.  Skin: Skin is warm and dry. Capillary refill takes less than 2 seconds. He is not diaphoretic.  Psychiatric: He has a normal mood  and affect. His behavior is normal.  Nursing note and vitals reviewed.    ED Treatments / Results  Labs (all labs ordered are listed, but only abnormal results are displayed) Labs Reviewed  BASIC METABOLIC PANEL - Abnormal; Notable for the following components:      Result Value   Glucose, Bld 329 (*)    All other components within normal limits  CBG MONITORING, ED - Abnormal; Notable for the following components:   Glucose-Capillary 180 (*)    All other components within normal limits  CBC WITH DIFFERENTIAL/PLATELET  TROPONIN I  D-DIMER, QUANTITATIVE (NOT AT [facility])    EKG EKG Interpretation  Date/Time:  Tuesday June 04 2018 15:20:11 EDT Ventricular Rate:  100 PR Interval:    QRS Duration: 128 QT Interval:  368 QTC Calculation: 475 R Axis:   -88 Text Interpretation:  Sinus tachycardia Nonspecific IVCD with LAD Biphasic ST elevation V2, consider Brugada No significant change since last tracing Confirmed by Alvira Monday ([id number]) on 06/04/2018 3:52:51 PM   Radiology Dg Chest 2 View  Result Date: 06/04/2018 CLINICAL DATA:  Shortness of breath.  Cough. EXAM: CHEST - 2 VIEW  COMPARISON:  Feb 21, 2018 FINDINGS: A loop recorder projects over the left side of the heart. The heart, hila, mediastinum, lungs, and pleura are unremarkable. No cause for cough identified. IMPRESSION: No active cardiopulmonary disease. Electronically Signed   By: Gerome Sam III M.D   On: 06/04/2018 14:03    Procedures Procedures (including critical care time)  Medications Ordered in ED Medications  ipratropium-albuterol (DUONEB) 0.5-2.5 (3) MG/3ML nebulizer solution 3 mL (3 mLs Nebulization Given 06/04/18 1521)  sodium chloride 0.9 % bolus 1,000 mL (0 mLs Intravenous Stopped 06/04/18 1724)  metFORMIN (GLUCOPHAGE) tablet 500 mg (500 mg Oral Given 06/04/18 1611)  albuterol (PROVENTIL HFA;VENTOLIN HFA) 108 (90 Base) MCG/ACT inhaler 2 puff (2 puffs Inhalation Given 06/04/18 1744)     Initial Impression / Assessment and Plan / ED Course  I have reviewed the triage vital signs and the nursing notes.  Pertinent labs & imaging results that were available during my care of the patient were reviewed by me and considered in my medical decision making (see chart for details).  Pt presents to the ED for evaluation of shortness of breath and cough for the past month.  Patient reports symptoms are intermittent but have been worsening.  He reports his chest feels tight like he cannot take a deep breath.  Patient has history of asthma, ran out of his inhaler, but has been using his son's nebulizer machine.  He reports he really has not had issues with asthma since he was a child.  The nebulizer machine seem to be helping somewhat but patient stopped using it.  Aside from cough, no associated symptoms, no fevers.  No chest pain.  No abdominal pain.  No syncope or lightheadedness.  On arrival patient mildly tachycardic at 112, all other vitals unremarkable.  Lungs are completely clear to auscultation with good air movement.  Heart RRR, abdominal exam is benign.  Given the patient's story and persistent shortness of  breath will get chest x-ray, EKG and labs.  Will give breathing treatment here in the ED.  Discussed with Dr. Dalene Seltzer who is in agreement with plan.  EKG is unchanged from previous continues to show Brugada syndrome, patient has not had any syncopal episodes with his symptoms.  Patient to follow-up with cardiology regarding his Brugada syndrome.  Chest x-ray shows no active cardiopulmonary disease.  There is no leukocytosis and normal hemoglobin.  Glucose is elevated at 329, patient has known diabetes and was previously on metformin but has been without this medication for at least a month as he lost his medical insurance.  No other acute electrolyte derangements and normal renal function.  Negative d-dimer negative troponin.  Will give 1 L of fluids and a dose of metformin here in the ED and reassess patient's blood glucose.  After treatment glucose has improved to 180.  Shortness of breath improved with nebulizer treatment, given that patient has no focal wheezing not feel that patient will require steroids especially in the setting of uncontrolled diabetes.  Provided inhaler here in the emergency department as well as prescription for metformin.  Patient to follow-up with the [facility] for further evaluation of his symptoms and continued treatment of his chronic medical conditions.  Vitals have remained stable here in the ED, no hypoxia or tachypnea and tachycardia has resolved.   At this time there does not appear to be any evidence of an acute emergency medical condition and the patient appears stable for discharge with appropriate outpatient follow up.Diagnosis was discussed with patient who verbalizes understanding and is agreeable to discharge. Pt case discussed with Dr. Dalene Seltzer who agrees with my plan.    Vitals:   06/04/18 1530 06/04/18 1700 06/04/18 1730 06/04/18 1744  BP: 115/79 130/78 132/83   Pulse: 87 92 76   Resp: 11 13 12    Temp: 98.2 F (36.8 C)      TempSrc:      SpO2: 96% 95% 98% 97%  Weight:      Height:         Final Clinical Impressions(s) / ED Diagnoses   Final diagnoses:  SOB (shortness of breath)  Bronchitis  Hyperglycemia    ED Discharge Orders         Ordered    metFORMIN (GLUCOPHAGE) 500 MG tablet  2 times daily with meals     06/04/18 1807           Dartha Lodge, [place] 06/04/18 1936    Alvira Monday, MD 06/05/18 1500

## 2018-06-04 NOTE — ED Triage Notes (Signed)
pt c/o SOB x 1 month, pro cough, no relief with OTC meds

## 2018-06-04 NOTE — Discharge Instructions (Signed)
Your work-up today is very reassuring, labs, chest x-ray and EKG do not suggest an acute problem with your heart or lungs causing her symptoms, no evidence of blood clot.  Symptoms may be due to bronchitis, please use albuterol treatments as needed.  Your glucose was elevated today and he will need to continue taking her metformin as directed.  You can follow-up with the [facility] for continued evaluation of your symptoms and management of your diabetes.  Return to the emergency department for fevers, worsening cough, shortness of breath or chest pain or any other new or concerning symptoms.

## 2018-06-04 NOTE — ED Notes (Signed)
EKG given to EDP.  

## 2018-06-04 NOTE — ED Notes (Signed)
Unable to start IV, labs sent

## 2018-06-11 ENCOUNTER — Telehealth: Payer: Self-pay

## 2018-06-11 NOTE — Telephone Encounter (Signed)
Attempted to confirm remote transmission with pt. No answer and was unable to leave a message.  Pt has not updated within 14 days.

## 2018-06-17 ENCOUNTER — Ambulatory Visit (INDEPENDENT_AMBULATORY_CARE_PROVIDER_SITE_OTHER): Payer: Self-pay | Admitting: *Deleted

## 2018-06-17 DIAGNOSIS — R55 Syncope and collapse: Secondary | ICD-10-CM

## 2018-06-17 NOTE — Progress Notes (Signed)
Carelink Summary Report / Loop Recorder 

## 2018-06-19 ENCOUNTER — Encounter: Payer: Self-pay | Admitting: Cardiology

## 2018-06-20 LAB — CUP PACEART REMOTE DEVICE CHECK
Implantable Pulse Generator Implant Date: 20180601
MDC IDC SESS DTM: 20190814023539

## 2018-06-30 LAB — CUP PACEART REMOTE DEVICE CHECK
Date Time Interrogation Session: 20190916104037
Implantable Pulse Generator Implant Date: 20180601

## 2018-07-22 ENCOUNTER — Ambulatory Visit (INDEPENDENT_AMBULATORY_CARE_PROVIDER_SITE_OTHER): Payer: Self-pay | Admitting: *Deleted

## 2018-07-22 DIAGNOSIS — R55 Syncope and collapse: Secondary | ICD-10-CM

## 2018-07-22 NOTE — Progress Notes (Signed)
Carelink Summary Report / Loop Recorder 

## 2018-08-09 LAB — CUP PACEART REMOTE DEVICE CHECK
Date Time Interrogation Session: 20191019103612
MDC IDC PG IMPLANT DT: 20180601

## 2018-08-22 ENCOUNTER — Ambulatory Visit (INDEPENDENT_AMBULATORY_CARE_PROVIDER_SITE_OTHER): Payer: Self-pay

## 2018-08-22 DIAGNOSIS — R55 Syncope and collapse: Secondary | ICD-10-CM

## 2018-08-22 NOTE — Progress Notes (Signed)
Carelink Summary Report / Loop Recorder 

## 2018-09-10 ENCOUNTER — Telehealth: Payer: Self-pay | Admitting: Cardiology

## 2018-09-10 NOTE — Telephone Encounter (Signed)
Attempted to call pt b/c his home monitor has been disconnected. No answer and unable to leave a message.

## 2018-09-17 ENCOUNTER — Encounter: Payer: Self-pay | Admitting: Cardiology

## 2018-09-17 ENCOUNTER — Telehealth: Payer: Self-pay

## 2018-09-17 NOTE — Telephone Encounter (Signed)
LMOVM requesting that pt send manual transmission b/c home monitor has not updated in at least 14 days.   Attempted to confirm remote transmission with pt. No answer and was unable to leave a message.   

## 2018-09-24 ENCOUNTER — Ambulatory Visit (INDEPENDENT_AMBULATORY_CARE_PROVIDER_SITE_OTHER): Payer: Self-pay

## 2018-09-24 DIAGNOSIS — R55 Syncope and collapse: Secondary | ICD-10-CM

## 2018-09-25 LAB — CUP PACEART REMOTE DEVICE CHECK
Date Time Interrogation Session: 20191224130912
MDC IDC PG IMPLANT DT: 20180601

## 2018-09-26 NOTE — Progress Notes (Signed)
Carelink Summary Report / Loop Recorder 

## 2018-10-06 LAB — CUP PACEART REMOTE DEVICE CHECK
MDC IDC PG IMPLANT DT: 20180601
MDC IDC SESS DTM: 20191121124113
